# Patient Record
Sex: Female | Born: 1950
Health system: Southern US, Community
[De-identification: ages and names within clinical notes are randomized; demographics above are authoritative.]

## PROBLEM LIST (undated history)

## (undated) DIAGNOSIS — I10 Essential (primary) hypertension: Secondary | ICD-10-CM

## (undated) HISTORY — PX: OTHER SURGICAL HISTORY: SHX169

## (undated) HISTORY — PX: MOHS SURGERY: SUR867

## (undated) HISTORY — PX: COLONOSCOPY: SHX174

## (undated) HISTORY — DX: Essential (primary) hypertension: I10

---

## 1998-05-28 ENCOUNTER — Other Ambulatory Visit: Admission: RE | Admit: 1998-05-28 | Discharge: 1998-05-28 | Payer: Self-pay | Admitting: Obstetrics & Gynecology

## 2002-02-20 ENCOUNTER — Other Ambulatory Visit: Admission: RE | Admit: 2002-02-20 | Discharge: 2002-02-20 | Payer: Self-pay | Admitting: Obstetrics & Gynecology

## 2002-05-13 ENCOUNTER — Encounter: Payer: Self-pay | Admitting: Obstetrics & Gynecology

## 2002-05-13 ENCOUNTER — Encounter: Admission: RE | Admit: 2002-05-13 | Discharge: 2002-05-13 | Payer: Self-pay | Admitting: Obstetrics & Gynecology

## 2003-04-13 ENCOUNTER — Other Ambulatory Visit: Admission: RE | Admit: 2003-04-13 | Discharge: 2003-04-13 | Payer: Self-pay | Admitting: Obstetrics & Gynecology

## 2004-07-07 ENCOUNTER — Other Ambulatory Visit: Admission: RE | Admit: 2004-07-07 | Discharge: 2004-07-07 | Payer: Self-pay | Admitting: Obstetrics & Gynecology

## 2006-04-23 ENCOUNTER — Ambulatory Visit: Payer: Self-pay | Admitting: Family Medicine

## 2006-04-23 LAB — CONVERTED CEMR LAB
ALT: 27 units/L (ref 0–40)
AST: 31 units/L (ref 0–37)
Albumin: 4.1 g/dL (ref 3.5–5.2)
Alkaline Phosphatase: 71 units/L (ref 39–117)
BUN: 15 mg/dL (ref 6–23)
Bilirubin, Direct: 0.1 mg/dL (ref 0.0–0.3)
CO2: 29 meq/L (ref 19–32)
Calcium: 9.7 mg/dL (ref 8.4–10.5)
Chloride: 103 meq/L (ref 96–112)
Cholesterol: 279 mg/dL (ref 0–200)
Creatinine, Ser: 0.8 mg/dL (ref 0.4–1.2)
Direct LDL: 197.8 mg/dL
GFR calc Af Amer: 96 mL/min
GFR calc non Af Amer: 79 mL/min
Glucose, Bld: 110 mg/dL — ABNORMAL HIGH (ref 70–99)
HDL: 55.3 mg/dL (ref >39.0)
Potassium: 4.1 meq/L (ref 3.5–5.1)
Sodium: 142 meq/L (ref 135–145)
Total Bilirubin: 1.2 mg/dL (ref 0.3–1.2)
Total CHOL/HDL Ratio: 5
Total Protein: 7.2 g/dL (ref 6.0–8.3)
Triglycerides: 116 mg/dL (ref 0–149)
VLDL: 23 mg/dL (ref 0–40)

## 2006-08-06 DIAGNOSIS — I10 Essential (primary) hypertension: Secondary | ICD-10-CM | POA: Insufficient documentation

## 2006-08-21 ENCOUNTER — Ambulatory Visit: Payer: Self-pay | Admitting: Family Medicine

## 2006-08-31 ENCOUNTER — Encounter (INDEPENDENT_AMBULATORY_CARE_PROVIDER_SITE_OTHER): Payer: Self-pay | Admitting: *Deleted

## 2006-11-23 ENCOUNTER — Ambulatory Visit: Payer: Self-pay | Admitting: Family Medicine

## 2006-11-28 ENCOUNTER — Encounter (INDEPENDENT_AMBULATORY_CARE_PROVIDER_SITE_OTHER): Payer: Self-pay | Admitting: *Deleted

## 2006-11-28 LAB — CONVERTED CEMR LAB
BUN: 11 mg/dL (ref 6–23)
CO2: 30 meq/L (ref 19–32)
Calcium: 9.4 mg/dL (ref 8.4–10.5)
Chloride: 104 meq/L (ref 96–112)
Creatinine, Ser: 0.8 mg/dL (ref 0.4–1.2)
GFR calc Af Amer: 96 mL/min
GFR calc non Af Amer: 79 mL/min
Glucose, Bld: 94 mg/dL (ref 70–99)
Hgb A1c MFr Bld: 6.5 % — ABNORMAL HIGH (ref 4.6–6.0)
Potassium: 3.7 meq/L (ref 3.5–5.1)
Sodium: 140 meq/L (ref 135–145)

## 2007-02-18 ENCOUNTER — Ambulatory Visit: Payer: Self-pay | Admitting: Family Medicine

## 2007-02-20 LAB — CONVERTED CEMR LAB
BUN: 11 mg/dL (ref 6–23)
CO2: 30 meq/L (ref 19–32)
Calcium: 9.4 mg/dL (ref 8.4–10.5)
Chloride: 100 meq/L (ref 96–112)
Creatinine, Ser: 0.9 mg/dL (ref 0.4–1.2)
GFR calc Af Amer: 84 mL/min
GFR calc non Af Amer: 69 mL/min
Glucose, Bld: 97 mg/dL (ref 70–99)
Hgb A1c MFr Bld: 5.5 % (ref 4.6–6.0)
Potassium: 4 meq/L (ref 3.5–5.1)
Sodium: 138 meq/L (ref 135–145)

## 2007-06-06 ENCOUNTER — Telehealth (INDEPENDENT_AMBULATORY_CARE_PROVIDER_SITE_OTHER): Payer: Self-pay | Admitting: *Deleted

## 2007-09-18 ENCOUNTER — Telehealth (INDEPENDENT_AMBULATORY_CARE_PROVIDER_SITE_OTHER): Payer: Self-pay | Admitting: *Deleted

## 2007-10-22 ENCOUNTER — Telehealth (INDEPENDENT_AMBULATORY_CARE_PROVIDER_SITE_OTHER): Payer: Self-pay | Admitting: *Deleted

## 2008-03-23 ENCOUNTER — Telehealth: Payer: Self-pay | Admitting: Family Medicine

## 2008-03-30 ENCOUNTER — Ambulatory Visit: Payer: Self-pay | Admitting: Family Medicine

## 2008-03-30 DIAGNOSIS — E785 Hyperlipidemia, unspecified: Secondary | ICD-10-CM | POA: Insufficient documentation

## 2008-04-02 ENCOUNTER — Ambulatory Visit: Payer: Self-pay | Admitting: Family Medicine

## 2008-04-20 LAB — CONVERTED CEMR LAB
ALT: 27 units/L (ref 0–35)
AST: 23 units/L (ref 0–37)
Albumin: 3.9 g/dL (ref 3.5–5.2)
Alkaline Phosphatase: 53 units/L (ref 39–117)
BUN: 17 mg/dL (ref 6–23)
Bilirubin, Direct: 0.1 mg/dL (ref 0.0–0.3)
CO2: 30 meq/L (ref 19–32)
Chloride: 102 meq/L (ref 96–112)
Cholesterol: 219 mg/dL (ref 0–200)
Creatinine, Ser: 0.8 mg/dL (ref 0.4–1.2)
Direct LDL: 146.2 mg/dL
GFR calc Af Amer: 95 mL/min
Glucose, Bld: 100 mg/dL — ABNORMAL HIGH (ref 70–99)
HDL: 58.8 mg/dL (ref >39.0)
Total Bilirubin: 1 mg/dL (ref 0.3–1.2)
Total CHOL/HDL Ratio: 3.7
Total Protein: 7.2 g/dL (ref 6.0–8.3)
Triglycerides: 78 mg/dL (ref 0–149)
VLDL: 16 mg/dL (ref 0–40)

## 2008-04-21 ENCOUNTER — Encounter (INDEPENDENT_AMBULATORY_CARE_PROVIDER_SITE_OTHER): Payer: Self-pay | Admitting: *Deleted

## 2008-04-28 ENCOUNTER — Ambulatory Visit: Payer: Self-pay | Admitting: Family Medicine

## 2008-04-28 ENCOUNTER — Other Ambulatory Visit: Admission: RE | Admit: 2008-04-28 | Discharge: 2008-04-28 | Payer: Self-pay | Admitting: Family Medicine

## 2008-04-28 ENCOUNTER — Encounter: Payer: Self-pay | Admitting: Family Medicine

## 2008-04-30 ENCOUNTER — Encounter (INDEPENDENT_AMBULATORY_CARE_PROVIDER_SITE_OTHER): Payer: Self-pay | Admitting: *Deleted

## 2008-05-06 ENCOUNTER — Encounter (INDEPENDENT_AMBULATORY_CARE_PROVIDER_SITE_OTHER): Payer: Self-pay | Admitting: *Deleted

## 2008-07-28 ENCOUNTER — Ambulatory Visit: Payer: Self-pay | Admitting: Family Medicine

## 2008-07-28 ENCOUNTER — Encounter (INDEPENDENT_AMBULATORY_CARE_PROVIDER_SITE_OTHER): Payer: Self-pay | Admitting: *Deleted

## 2008-07-28 LAB — CONVERTED CEMR LAB
Alkaline Phosphatase: 62 units/L (ref 39–117)
Bilirubin, Direct: 0.1 mg/dL (ref 0.0–0.3)
CO2: 30 meq/L (ref 19–32)
Calcium: 9.4 mg/dL (ref 8.4–10.5)
Chloride: 101 meq/L (ref 96–112)
Sodium: 138 meq/L (ref 135–145)
Total CHOL/HDL Ratio: 3
Total Protein: 7.2 g/dL (ref 6.0–8.3)

## 2008-10-29 ENCOUNTER — Ambulatory Visit: Payer: Self-pay | Admitting: Family Medicine

## 2009-02-01 ENCOUNTER — Ambulatory Visit: Payer: Self-pay | Admitting: Family Medicine

## 2009-02-01 DIAGNOSIS — Z780 Asymptomatic menopausal state: Secondary | ICD-10-CM | POA: Insufficient documentation

## 2009-02-02 ENCOUNTER — Telehealth (INDEPENDENT_AMBULATORY_CARE_PROVIDER_SITE_OTHER): Payer: Self-pay | Admitting: *Deleted

## 2009-02-02 LAB — CONVERTED CEMR LAB
Alkaline Phosphatase: 66 units/L (ref 39–117)
Bilirubin, Direct: 0.1 mg/dL (ref 0.0–0.3)
CO2: 31 meq/L (ref 19–32)
Calcium: 9.5 mg/dL (ref 8.4–10.5)
GFR calc non Af Amer: 91.69 mL/min (ref >60)
HDL: 59.1 mg/dL (ref >39.00)
Sodium: 140 meq/L (ref 135–145)
Total Bilirubin: 0.8 mg/dL (ref 0.3–1.2)
Total CHOL/HDL Ratio: 3
Total Protein: 7.9 g/dL (ref 6.0–8.3)

## 2009-02-16 ENCOUNTER — Encounter: Payer: Self-pay | Admitting: Family Medicine

## 2009-02-16 ENCOUNTER — Encounter: Admission: RE | Admit: 2009-02-16 | Discharge: 2009-02-16 | Payer: Self-pay | Admitting: Family Medicine

## 2009-02-23 ENCOUNTER — Telehealth: Payer: Self-pay | Admitting: Family Medicine

## 2009-11-12 ENCOUNTER — Encounter (INDEPENDENT_AMBULATORY_CARE_PROVIDER_SITE_OTHER): Payer: Self-pay | Admitting: *Deleted

## 2009-12-01 ENCOUNTER — Telehealth (INDEPENDENT_AMBULATORY_CARE_PROVIDER_SITE_OTHER): Payer: Self-pay | Admitting: *Deleted

## 2009-12-03 ENCOUNTER — Ambulatory Visit: Payer: Self-pay | Admitting: Family Medicine

## 2009-12-17 ENCOUNTER — Ambulatory Visit: Payer: Self-pay | Admitting: Family Medicine

## 2009-12-21 ENCOUNTER — Encounter: Payer: Self-pay | Admitting: Family Medicine

## 2009-12-31 ENCOUNTER — Telehealth (INDEPENDENT_AMBULATORY_CARE_PROVIDER_SITE_OTHER): Payer: Self-pay | Admitting: *Deleted

## 2010-01-07 ENCOUNTER — Ambulatory Visit: Payer: Self-pay | Admitting: Family Medicine

## 2010-01-07 DIAGNOSIS — E039 Hypothyroidism, unspecified: Secondary | ICD-10-CM | POA: Insufficient documentation

## 2010-01-10 LAB — CONVERTED CEMR LAB: Free T4: 0.93 ng/dL (ref 0.60–1.60)

## 2010-04-03 LAB — CONVERTED CEMR LAB
BUN: 12 mg/dL (ref 6–23)
Basophils Absolute: 0 10*3/uL (ref 0.0–0.1)
Basophils Relative: 0.8 % (ref 0.0–3.0)
CO2: 30 meq/L (ref 19–32)
Calcium: 9.3 mg/dL (ref 8.4–10.5)
Chloride: 98 meq/L (ref 96–112)
Creatinine, Ser: 0.8 mg/dL (ref 0.4–1.2)
Eosinophils Absolute: 0.1 10*3/uL (ref 0.0–0.7)
GFR calc Af Amer: 96 mL/min
GFR calc non Af Amer: 79 mL/min
Glucose, Bld: 96 mg/dL (ref 70–99)
Hemoglobin: 14.4 g/dL (ref 12.0–15.0)
Hgb A1c MFr Bld: 5.6 % (ref 4.6–6.0)
MCHC: 34.7 g/dL (ref 30.0–36.0)
MCV: 88.8 fL (ref 78.0–100.0)
Monocytes Absolute: 0.3 10*3/uL (ref 0.1–1.0)
Neutro Abs: 3 10*3/uL (ref 1.4–7.7)
Potassium: 3.9 meq/L (ref 3.5–5.1)
RBC: 4.68 M/uL (ref 3.87–5.11)
Sodium: 136 meq/L (ref 135–145)

## 2010-04-07 NOTE — Progress Notes (Signed)
----   Converted from flag ---- ---- 12/31/2009 10:39 AM, Okey Regal Spring wrote: mailed letter ---- 12/27/2009 4:33 PM, Okey Regal Spring wrote: lmom for patient to call & schedule appt    ---- 12/27/2009 11:37 AM, Harold Barban wrote: Since I will not be at that office till thursday can you call this patient to sch her labs. I call her on friday and left a message. Thanks!  ---- 12/24/2009 10:47 AM, Almeta Monas CMA (AAMA) wrote: Can you make this pt an appt to review labs one day next week with Dr.Lowne please. Thank You ------------------------------

## 2010-04-07 NOTE — Assessment & Plan Note (Signed)
Summary: Review [facility]//KP   Vital Signs:  Patient profile:   60 year old female Height:      63 inches Weight:      161.6 pounds Pulse rate:   72 / minute Pulse rhythm:   regular BP sitting:   130 / 74  (left arm) Cuff size:   regular  Vitals Entered By: Almeta Monas CMA Duncan Dull) (January 07, 2010 8:17 AM) CC: Review [facility]   History of Present Illness: Pt here to discuss [facility]----see labs scanned in .  Current Medications (verified): 1)  Toprol Xl 100 Mg Xr24h-Tab (Metoprolol Succinate) .Marland Kitchen.. 1 By Mouth Once Daily* 2)  Hydrochlorothiazide 50 Mg Tabs (Hydrochlorothiazide) .... Take 1 Tablet By Mouth Once A Day 3)  Aspir-Low 81 Mg Tbec (Aspirin) .Marland Kitchen.. 1 By Mouth Qd 4)  Multivitamins  Tabs (Multiple Vitamin) .Marland Kitchen.. 1 By Mouth Once Daily  Allergies (verified): No Known Drug Allergies  Physical Exam  General:  Well-developed,well-nourished,in no acute distress; alert,appropriate and cooperative throughout examination Psych:  Cognition and judgment appear intact. Alert and cooperative with normal attention span and concentration. No apparent delusions, illusions, hallucinations   Impression & Recommendations:  Problem # 1:  HYPOTHYROIDISM (ICD-244.9)  Orders: Venipuncture (04540) TLB-TSH (Thyroid Stimulating Hormone) (84443-TSH) TLB-T3, Free (Triiodothyronine) (84481-T3FREE) TLB-T4 (Thyrox), Free [phone])  Labs Reviewed: TSH: 3.75 (04/28/2008)    HgBA1c: 5.5 (02/18/2007) Chol: 189 (02/01/2009)   HDL: 59.10 (02/01/2009)   LDL: 111 (02/01/2009)   TG: 97.0 (02/01/2009)  Problem # 2:  HYPERLIPIDEMIA (ICD-272.4) Pt needs cholesterol meds but TSH high---recheck thyroid and correct if needed then we will recheck cholesterol Labs Reviewed: SGOT: 27 (02/01/2009)   SGPT: 28 (02/01/2009)   HDL:59.10 (02/01/2009), 62.00 (07/28/2008)  LDL:111 (02/01/2009), 99 (29/56/2130)  Chol:189 (02/01/2009), 175 (07/28/2008)  Trig:97.0 (02/01/2009), 70.0  (07/28/2008)  Problem # 3:  HYPERTENSION (ICD-401.9)  Her updated medication list for this problem includes:    Toprol Xl 100 Mg Xr24h-tab (Metoprolol succinate) .Marland Kitchen... 1 by mouth once daily*    Hydrochlorothiazide 50 Mg Tabs (Hydrochlorothiazide) .Marland Kitchen... Take 1 tablet by mouth once a day  BP today: 130/74 Prior BP: 120/90 (12/17/2009)  Labs Reviewed: K+: 3.5 (02/01/2009) Creat: : 0.7 (02/01/2009)   Chol: 189 (02/01/2009)   HDL: 59.10 (02/01/2009)   LDL: 111 (02/01/2009)   TG: 97.0 (02/01/2009)  Complete Medication List: 1)  Toprol Xl 100 Mg Xr24h-tab (Metoprolol succinate) .Marland Kitchen.. 1 by mouth once daily* 2)  Hydrochlorothiazide 50 Mg Tabs (Hydrochlorothiazide) .... Take 1 tablet by mouth once a day 3)  Aspir-low 81 Mg Tbec (Aspirin) .Marland Kitchen.. 1 by mouth qd 4)  Multivitamins Tabs (Multiple vitamin) .Marland Kitchen.. 1 by mouth once daily   Orders Added: 1)  Venipuncture [36415] 2)  TLB-TSH (Thyroid Stimulating Hormone) [84443-TSH] 3)  TLB-T3, Free (Triiodothyronine) [86578-I6NGEX] 4)  TLB-T4 (Thyrox), Free [52841-LK4M] 5)  Est. Patient Level III [01027]  Appended Document: Review [facility]//KP

## 2010-04-07 NOTE — Assessment & Plan Note (Signed)
Summary: rto per dr request/cbs   Vital Signs:  Patient profile:   60 year old female Height:      63 inches Weight:      162.2 pounds BMI:     28.84 Temp:     98.0 degrees F oral Pulse rate:   84 / minute Pulse rhythm:   regular BP sitting:   130 / 86  (right arm) Cuff size:   regular  Vitals Entered By: Almeta Monas CMA Duncan Dull) (December 03, 2009 8:23 AM) CC: f/u on meds, pt stopped cholesterol meds   History of Present Illness:  Hyperlipidemia follow-up      This is a 60 year old woman who presents for Hyperlipidemia follow-up.  The patient denies muscle aches, GI upset, abdominal pain, flushing, itching, constipation, diarrhea, and fatigue.  The patient denies the following symptoms: chest pain/pressure, exercise intolerance, dypsnea, palpitations, syncope, and pedal edema.  Compliance with medications (by patient report) has been near poor.  Dietary compliance has been fair.  The patient reports exercising occasionally.  Adjunctive measures currently used by the patient include ASA.  Pt stopped meds ---"just because I didn't want to take all that medicine."  Hypertension follow-up      The patient also presents for Hypertension follow-up.  The patient denies lightheadedness, urinary frequency, headaches, edema, impotence, rash, and fatigue.  The patient denies the following associated symptoms: chest pain, chest pressure, exercise intolerance, dyspnea, palpitations, syncope, leg edema, and pedal edema.  Compliance with medications (by patient report) has been sporadic.  The patient reports that dietary compliance has been fair.  The patient reports exercising occasionally.  Adjunctive measures currently used by the patient include salt restriction.    Preventive Screening-Counseling & Management  Alcohol-Tobacco     Alcohol drinks/day: <1     Alcohol type: rare     Smoking Status: never     Passive Smoke Exposure: no  Caffeine-Diet-Exercise     Caffeine use/day: 3     Does  Patient Exercise: no  Current Medications (verified): 1)  Toprol Xl 100 Mg Xr24h-Tab (Metoprolol Succinate) .Marland Kitchen.. 1 By Mouth Once Daily* 2)  Hydrochlorothiazide 50 Mg Tabs (Hydrochlorothiazide) .... Take 1 Tablet By Mouth Once A Day 3)  Aspir-Low 81 Mg Tbec (Aspirin) .Marland Kitchen.. 1 By Mouth Qd 4)  Multivitamins  Tabs (Multiple Vitamin) .Marland Kitchen.. 1 By Mouth Once Daily  Allergies (verified): No Known Drug Allergies  Past History:  Past Medical History: Last updated: 03/30/2008 Hypertension High Cholesteral Hyperlipidemia  Past Surgical History: Last updated: 04/28/2008 Denies surgical history  Family History: Last updated: 08/06/2006 Family History Hypertension Family Hitory Heat Attack  Social History: Last updated: 04/28/2008 Occupation: product development---[employer] Married Never Smoked Alcohol use-yes-rare Regular exercise-no  Risk Factors: Alcohol Use: <1 (12/03/2009) Caffeine Use: 3 (12/03/2009) Exercise: no (12/03/2009)  Risk Factors: Smoking Status: never (12/03/2009) Passive Smoke Exposure: no (12/03/2009)  Family History: Reviewed history from 08/06/2006 and no changes required. Family History Hypertension Family Hitory Heat Attack  Social History: Reviewed history from 04/28/2008 and no changes required. 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   Lungs:  Normal respiratory effort, chest expands symmetrically. Lungs are clear to auscultation, no crackles or wheezes. Heart:  Normal rate and regular rhythm. S1 and S2 normal without gallop, murmur,  click, rub or other extra sounds. Abdomen:  Bowel sounds positive,abdomen soft and non-tender without masses,  organomegaly or hernias noted. Extremities:  No clubbing, cyanosis, edema, or deformity noted with normal full range of motion of all joints.   Psych:  Oriented X3 and normally interactive.     Impression & Recommendations:  Problem # 1:  HYPERLIPIDEMIA (ICD-272.4) D/W pt importance of meds--we will check advanced lipid testing and discuss further with pt in 2 weeks The following medications were removed from the medication list:    Vytorin 10-40 Mg Tabs (Ezetimibe-simvastatin) .Marland Kitchen... 1 by mouth at bedtime.    Zocor 80 Mg Tabs (Simvastatin) .Marland Kitchen... 1 by mouth at bedtime. needs labwork before additonal refills.  Orders: T- * Misc. Laboratory test (99999)--[facility]  Labs Reviewed: SGOT: 27 (02/01/2009)   SGPT: 28 (02/01/2009)   HDL:59.10 (02/01/2009), 62.00 (07/28/2008)  LDL:111 (02/01/2009), 99 (04/54/0981)  Chol:189 (02/01/2009), 175 (07/28/2008)  Trig:97.0 (02/01/2009), 70.0 (07/28/2008)  Problem # 2:  HYPERTENSION (ICD-401.9)  Her updated medication list for this problem includes:    Toprol Xl 100 Mg Xr24h-tab (Metoprolol succinate) .Marland Kitchen... 1 by mouth once daily*    Hydrochlorothiazide 50 Mg Tabs (Hydrochlorothiazide) .Marland Kitchen... Take 1 tablet by mouth once a day  Orders: T- * Misc. Laboratory test [phone])  BP today: 130/86 Prior BP: 130/80 (02/01/2009)  Labs Reviewed: K+: 3.5 (02/01/2009) Creat: : 0.7 (02/01/2009)   Chol: 189 (02/01/2009)   HDL: 59.10 (02/01/2009)   LDL: 111 (02/01/2009)   TG: 97.0 (02/01/2009)  Complete Medication List: 1)  Toprol Xl 100 Mg Xr24h-tab (Metoprolol succinate) .Marland Kitchen.. 1 by mouth once daily* 2)  Hydrochlorothiazide 50 Mg Tabs (Hydrochlorothiazide) .... Take 1 tablet by mouth once a day 3)  Aspir-low 81 Mg Tbec (Aspirin) .Marland Kitchen.. 1 by mouth qd 4)  Multivitamins Tabs (Multiple vitamin) .Marland Kitchen.. 1 by mouth once daily  Other Orders: Venipuncture (82956)  Patient Instructions: 1)  Please schedule a follow-up appointment in 2 weeks to review  labs Prescriptions: HYDROCHLOROTHIAZIDE 50 MG TABS (HYDROCHLOROTHIAZIDE) Take 1 tablet by mouth once a day  #90 Each x 3   Entered and Authorized by:   Loreen Freud DO   Signed by:   Loreen Freud DO on 12/03/2009   Method used:   Electronically to        [facility]* (retail)       [address] [place], [place]  [address]       Ph: [phone]       Fax: [phone]   RxID:   [id number] TOPROL XL 100 MG XR24H-TAB (METOPROLOL SUCCINATE) 1 by mouth once daily*  #90 x 3   Entered and Authorized by:   Loreen Freud DO   Signed by:   Loreen Freud DO on 12/03/2009   Method used:   Electronically to        [facility]* (retail)       [address] [place], [place]  [address]       Ph: [phone]       Fax: [phone]   RxID:   [id number]

## 2010-04-07 NOTE — Progress Notes (Signed)
Summary: toprol refill   Phone Note Refill Request   Refills Requested: Medication #1:  TOPROL XL 100 MG XR24H-TAB 1 by mouth once daily*8OFFICE VISIT DUE NOW ** [facility] - patient out of med  Initial call taken by: Okey Regal Spring,  December 01, 2009 8:59 AM    New/Updated Medications: TOPROL XL 100 MG XR24H-TAB (METOPROLOL SUCCINATE) 1 by mouth once daily* Prescriptions: TOPROL XL 100 MG XR24H-TAB (METOPROLOL SUCCINATE) 1 by mouth once daily*  #30 x 0   Entered by:   Doristine Devoid CMA   Authorized by:   Loreen Freud DO   Signed by:   Doristine Devoid CMA on 12/01/2009   Method used:   Electronically to        [facility]* (retail)       [address] [place], [place]  [address]       Ph: [phone]       Fax: [phone]   RxID:   [id number]

## 2010-04-07 NOTE — Assessment & Plan Note (Signed)
Summary: review lab work//lch   Vital Signs:  Patient profile:   60 year old female Weight:      160 pounds Temp:     98.2 degrees F oral Pulse rate:   72 / minute Pulse rhythm:   regular BP sitting:   120 / 90  (right arm) Cuff size:   regular  Vitals Entered By: Almeta Monas CMA Duncan Dull) (December 17, 2009 10:38 AM) CC: bp check and lab f/u Comments 150/83 right arm, and 148/87 on left arm on pt's cuff   History of Present Illness: pt here to go over labs---her labs are not in--- we will call when they come in   Current Medications (verified): 1)  Toprol Xl 100 Mg Xr24h-Tab (Metoprolol Succinate) .Marland Kitchen.. 1 By Mouth Once Daily* 2)  Hydrochlorothiazide 50 Mg Tabs (Hydrochlorothiazide) .... Take 1 Tablet By Mouth Once A Day 3)  Aspir-Low 81 Mg Tbec (Aspirin) .Marland Kitchen.. 1 By Mouth Qd 4)  Multivitamins  Tabs (Multiple Vitamin) .Marland Kitchen.. 1 By Mouth Once Daily  Allergies (verified): No Known Drug Allergies   Complete Medication List: 1)  Toprol Xl 100 Mg Xr24h-tab (Metoprolol succinate) .Marland Kitchen.. 1 by mouth once daily* 2)  Hydrochlorothiazide 50 Mg Tabs (Hydrochlorothiazide) .... Take 1 tablet by mouth once a day 3)  Aspir-low 81 Mg Tbec (Aspirin) .Marland Kitchen.. 1 by mouth qd 4)  Multivitamins Tabs (Multiple vitamin) .Marland Kitchen.. 1 by mouth once daily  Other Orders: No Charge Patient Arrived (NCPA0) (NCPA0)

## 2010-04-07 NOTE — Letter (Signed)
Summary: Primary Care Appointment Letter  [facility]  [address] [place], [place] [address]   Phone: [phone]  Fax: [phone]    11/12/2009 MRN: [id number]  [facility] [address] [place], [place]  [address]  Dear Ms. Tippins,   Your Primary Care Physician Loreen Freud DO has indicated that:    ___X____it is time to schedule an appointment.    _______you missed your appointment on______ and need to call and          reschedule.    _______you need to have lab work done.    _______you need to schedule an appointment discuss lab or test results.    _______you need to call to reschedule your appointment that is                       scheduled on _________.     Please call our office as soon as possible. Our phone number is [phone]. Please press option 1. Our office is open 8a-12noon and 1p-5p, Monday through Friday.     Thank you,    [facility] Scheduler

## 2010-05-20 IMAGING — MG MM DIGITAL SCREENING
4 series · 4 of 4 positions shown · non-contrast
Comparison: Prior studies.

DG SCREEN MAMMOGRAM BILATERAL
Bilateral CC and MLO view(s) were taken.

DIGITAL SCREENING MAMMOGRAM WITH CAD:

[R CC]
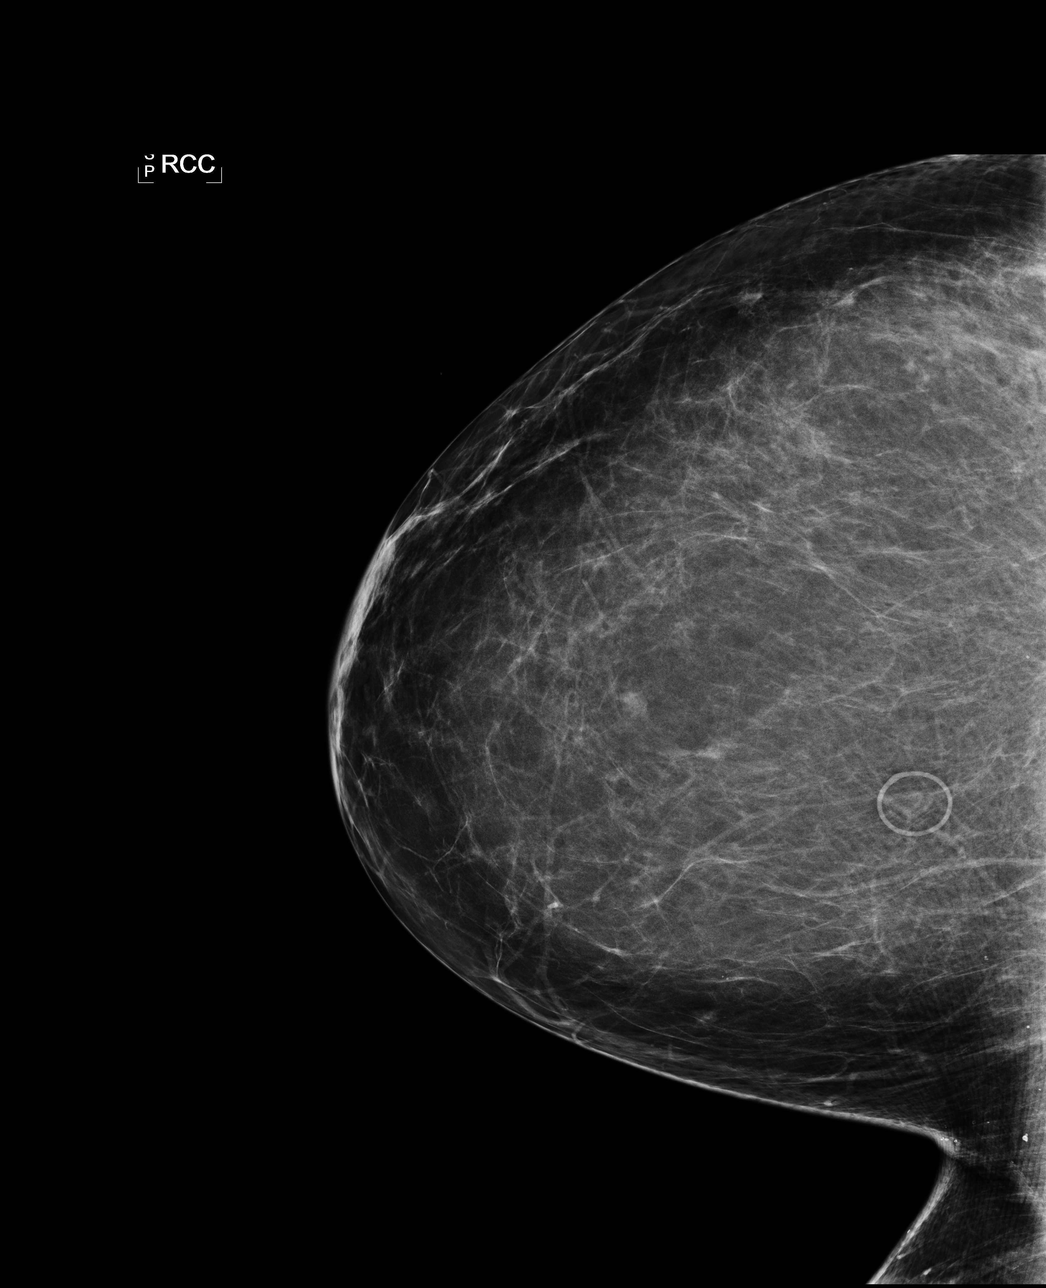

[L CC]
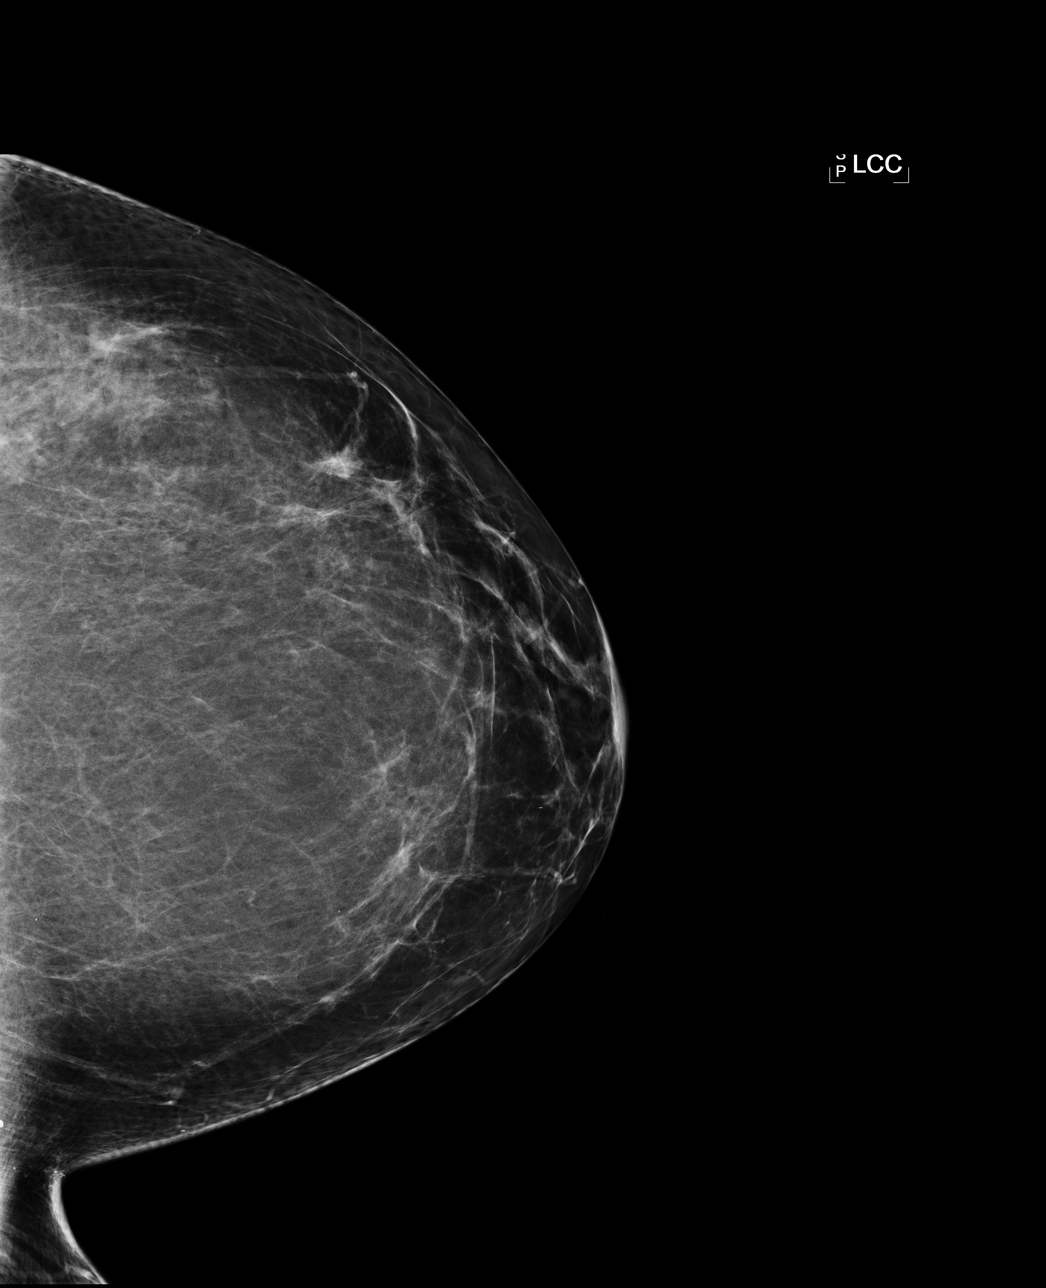

[L MLO]
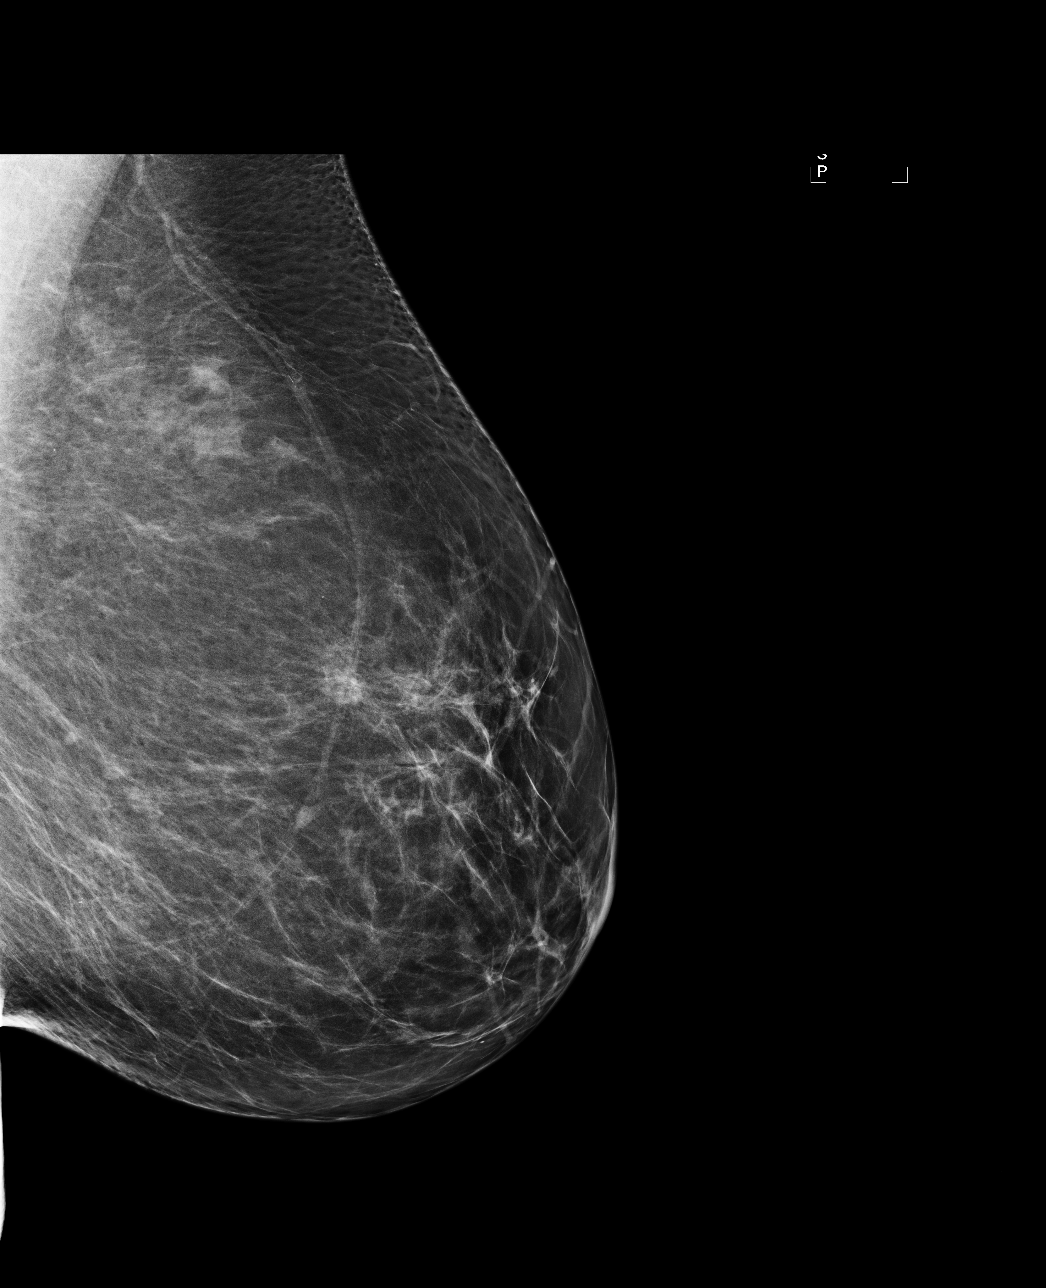

[R MLO]
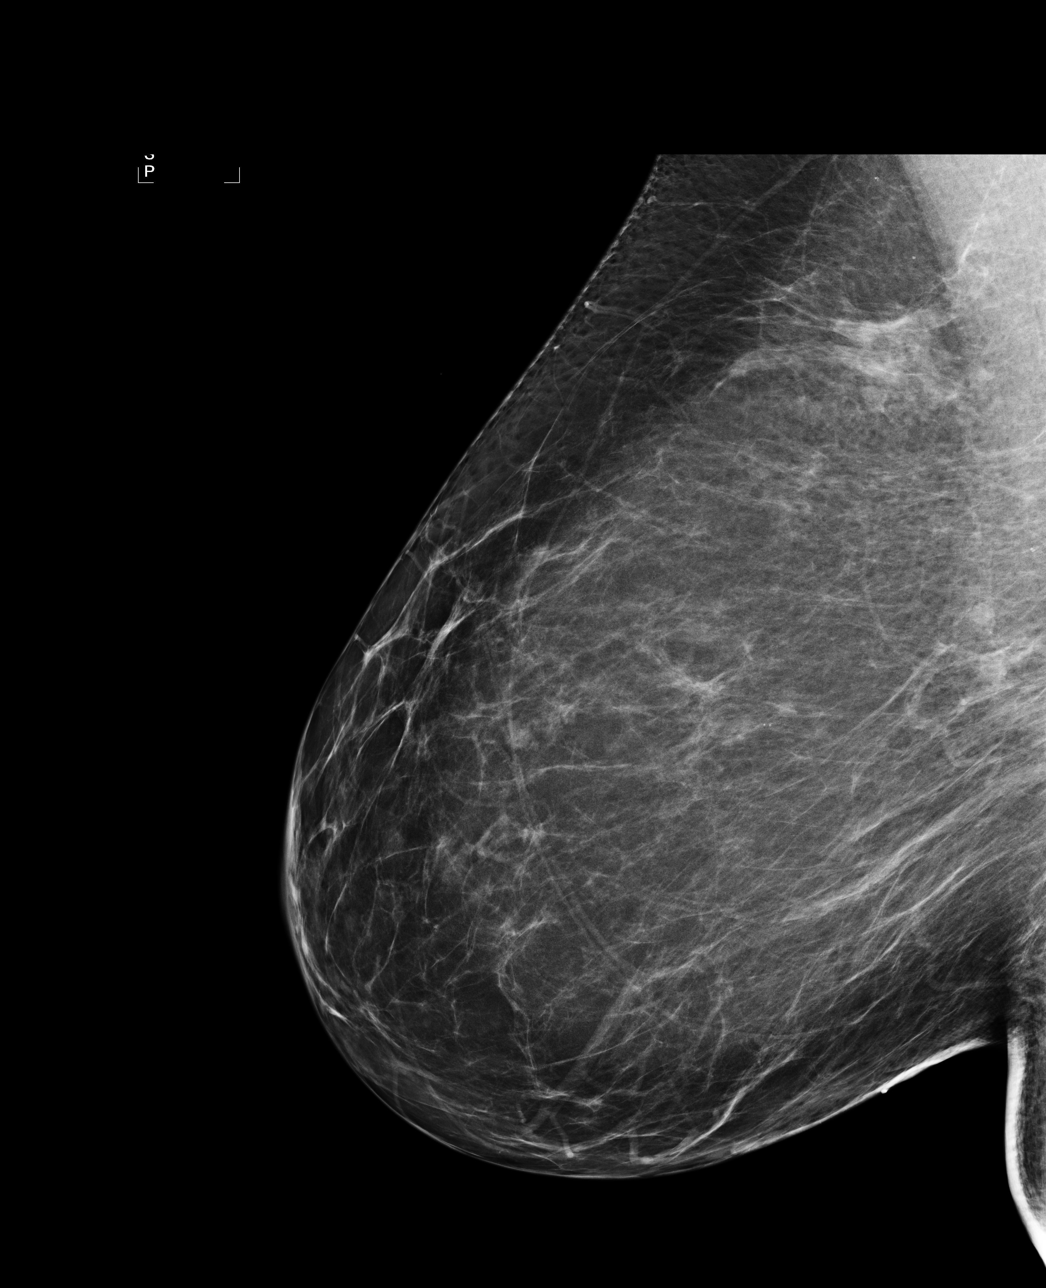

[4 of 4 positions shown; findings below may reference images not displayed]

There are scattered fibroglandular densities.  There is no dominant mass, architectural distortion 
or calcification to suggest malignancy.

Images were processed with CAD.
IMPRESSION: No mammographic evidence of malignancy.  Suggest yearly screening mammography.

A result letter of this screening mammogram will be mailed directly to the patient.

ASSESSMENT: Negative - BI-RADS 1

Screening mammogram in 1 year.
,

## 2011-01-20 ENCOUNTER — Other Ambulatory Visit: Payer: Self-pay | Admitting: Family Medicine

## 2011-01-20 NOTE — Telephone Encounter (Signed)
Letter mailed to schedule a CPE.      KP 

## 2011-01-24 ENCOUNTER — Other Ambulatory Visit: Payer: Self-pay | Admitting: Family Medicine

## 2011-01-25 ENCOUNTER — Other Ambulatory Visit: Payer: Self-pay | Admitting: Family Medicine

## 2011-01-25 MED ORDER — METOPROLOL SUCCINATE ER 100 MG PO TB24: 100.0000 mg | ORAL_TABLET | Freq: Every day | ORAL | Status: DC

## 2011-01-25 MED ORDER — HYDROCHLOROTHIAZIDE 50 MG PO TABS: 50.0000 mg | ORAL_TABLET | Freq: Every day | ORAL | Status: DC

## 2011-01-25 NOTE — Telephone Encounter (Signed)
Refaxed   KP 

## 2011-02-20 ENCOUNTER — Other Ambulatory Visit: Payer: Self-pay | Admitting: Family Medicine

## 2011-03-09 ENCOUNTER — Ambulatory Visit (INDEPENDENT_AMBULATORY_CARE_PROVIDER_SITE_OTHER): Payer: BC Managed Care – PPO | Admitting: Family Medicine

## 2011-03-09 ENCOUNTER — Encounter: Payer: Self-pay | Admitting: Family Medicine

## 2011-03-09 ENCOUNTER — Other Ambulatory Visit (HOSPITAL_COMMUNITY)
Admission: RE | Admit: 2011-03-09 | Discharge: 2011-03-09 | Disposition: A | Discharge status: Home / Self Care | Payer: BC Managed Care – PPO | Source: Ambulatory Visit | Attending: Family Medicine | Admitting: Family Medicine

## 2011-03-09 DIAGNOSIS — E785 Hyperlipidemia, unspecified: Secondary | ICD-10-CM

## 2011-03-09 DIAGNOSIS — Z780 Asymptomatic menopausal state: Secondary | ICD-10-CM

## 2011-03-09 DIAGNOSIS — Z0000 Encounter for general adult medical examination without abnormal findings: Secondary | ICD-10-CM

## 2011-03-09 DIAGNOSIS — L259 Unspecified contact dermatitis, unspecified cause: Secondary | ICD-10-CM

## 2011-03-09 DIAGNOSIS — I10 Essential (primary) hypertension: Secondary | ICD-10-CM

## 2011-03-09 DIAGNOSIS — E039 Hypothyroidism, unspecified: Secondary | ICD-10-CM

## 2011-03-09 DIAGNOSIS — Z23 Encounter for immunization: Secondary | ICD-10-CM

## 2011-03-09 DIAGNOSIS — Z01419 Encounter for gynecological examination (general) (routine) without abnormal findings: Secondary | ICD-10-CM | POA: Insufficient documentation

## 2011-03-09 DIAGNOSIS — L309 Dermatitis, unspecified: Secondary | ICD-10-CM

## 2011-03-09 DIAGNOSIS — N6459 Other signs and symptoms in breast: Secondary | ICD-10-CM

## 2011-03-09 MED ORDER — METOPROLOL SUCCINATE ER 100 MG PO TB24: 100.0000 mg | ORAL_TABLET | Freq: Every day | ORAL | Status: DC

## 2011-03-09 MED ORDER — HYDROCHLOROTHIAZIDE 50 MG PO TABS: 50.0000 mg | ORAL_TABLET | Freq: Every day | ORAL | Status: DC

## 2011-03-09 NOTE — Assessment & Plan Note (Addendum)
Check labs 

## 2011-03-09 NOTE — Patient Instructions (Signed)

## 2011-03-09 NOTE — Assessment & Plan Note (Signed)
Check labs 

## 2011-03-09 NOTE — Progress Notes (Signed)
Subjective:     Amy Zimmerman is a 61 y.o. female and is here for a comprehensive physical exam. The patient reports no problems.  History   Social History  . Marital Status: Married    Spouse Name: N/A    Number of Children: N/A  . Years of Education: N/A   Occupational History  . [employer]    Social History Main Topics  . Smoking status: Never Smoker   . Smokeless tobacco: Never Used  . Alcohol Use: No  . Drug Use: No  . Sexually Active: Yes -- Female partner(s)   Other Topics Concern  . Not on file   Social History Narrative  . No narrative on file   Health Maintenance  Topic Date Due  . Pap Smear  02/27/1970  . Tetanus/tdap  02/28/1971  . Colonoscopy  02/27/2002  . Mammogram  02/17/2011  . Influenza Vaccine  12/05/2011    The following portions of the patient's history were reviewed and updated as appropriate: allergies, current medications, past family history, past medical history, past social history, past surgical history and problem list.  Review of Systems Review of Systems  Constitutional: Negative for activity change, appetite change and fatigue.  HENT: Negative for hearing loss, congestion, tinnitus and ear discharge.  dentist q67m Eyes: Negative for visual disturbance (see optho q1y -- vision corrected to 20/20 with glasses).  Respiratory: Negative for cough, chest tightness and shortness of breath.   Cardiovascular: Negative for chest pain, palpitations and leg swelling.  Gastrointestinal: Negative for abdominal pain, diarrhea, constipation and abdominal distention.  Genitourinary: Negative for urgency, frequency, decreased urine volume and difficulty urinating.  Musculoskeletal: Negative for back pain, arthralgias and gait problem.  Skin: Negative for color change, pallor and rash.  Neurological: Negative for dizziness, light-headedness, numbness and headaches.  Hematological: Negative for adenopathy. Does not bruise/bleed easily.    Psychiatric/Behavioral: Negative for suicidal ideas, confusion, sleep disturbance, self-injury, dysphoric mood, decreased concentration and agitation.       Objective:    BP 126/80  Pulse 81  Temp(Src) 98.2 F (36.8 C) (Oral)  Ht 5' 2.25" (1.581 m)  Wt 164 lb 3.2 oz (74.481 kg)  BMI 29.79 kg/m2  SpO2 98%  General Appearance:    Alert, cooperative, no distress, appears stated age  Head:    Normocephalic, without obvious abnormality, atraumatic  Eyes:    PERRL, conjunctiva/corneas clear, EOM's intact, fundi    benign, both eyes  Ears:    Normal TM's and external ear canals, both ears  Nose:   Nares normal, septum midline, mucosa normal, no drainage    or sinus tenderness  Throat:   Lips, mucosa, and tongue normal; teeth and gums normal  Neck:   Supple, symmetrical, trachea midline, no adenopathy;    thyroid:  no enlargement/tenderness/nodules; no carotid   bruit or JVD  Back:     Symmetric, no curvature, ROM normal, no CVA tenderness  Lungs:     Clear to auscultation bilaterally, respirations unlabored  Chest Wall:    No tenderness or deformity   Heart:    Regular rate and rhythm, S1 and S2 normal, no murmur, rub   or gallop  Breast Exam:    No tenderness, masses, or nipple abnormality  Abdomen:     Soft, non-tender, bowel sounds active all four quadrants,    no masses, no organomegaly  Genitalia:    Normal female without lesion, discharge or tenderness, pap done  Rectal:    Normal  tone,, no masses or tenderness;   guaiac negative stool  Extremities:   Extremities normal, atraumatic, no cyanosis or edema  Pulses:   2+ and symmetric all extremities  Skin:   Skin color, texture, turgor normal, no rashes or lesions  Lymph nodes:   Cervical, supraclavicular, and axillary nodes normal  Neurologic:   CNII-XII intact, normal strength, sensation and reflexes    throughout     Assessment:    Healthy female exam.      Plan:    [facility] utd Check labs See After Visit Summary  for Counseling Recommendations

## 2011-03-09 NOTE — Assessment & Plan Note (Signed)
Stable con't meds 

## 2011-03-14 ENCOUNTER — Other Ambulatory Visit: Payer: Self-pay | Admitting: Family Medicine

## 2011-03-14 ENCOUNTER — Other Ambulatory Visit (INDEPENDENT_AMBULATORY_CARE_PROVIDER_SITE_OTHER): Payer: BC Managed Care – PPO

## 2011-03-14 DIAGNOSIS — I10 Essential (primary) hypertension: Secondary | ICD-10-CM

## 2011-03-14 DIAGNOSIS — Z0000 Encounter for general adult medical examination without abnormal findings: Secondary | ICD-10-CM

## 2011-03-14 LAB — LIPID PANEL
HDL: 49.9 mg/dL (ref >39.00)
Triglycerides: 162 mg/dL — ABNORMAL HIGH (ref 0.0–149.0)
VLDL: 32.4 mg/dL (ref 0.0–40.0)

## 2011-03-14 LAB — CBC WITH DIFFERENTIAL/PLATELET
Eosinophils Relative: 3.3 % (ref 0.0–5.0)
HCT: 40.8 % (ref 36.0–46.0)
Hemoglobin: 13.9 g/dL (ref 12.0–15.0)
Lymphs Abs: 2.5 10*3/uL (ref 0.7–4.0)
MCV: 87.6 fl (ref 78.0–100.0)
Monocytes Absolute: 0.4 10*3/uL (ref 0.1–1.0)
Monocytes Relative: 6.6 % (ref 3.0–12.0)
Neutro Abs: 2.7 10*3/uL (ref 1.4–7.7)
RDW: 13.2 % (ref 11.5–14.6)
WBC: 5.9 10*3/uL (ref 4.5–10.5)

## 2011-03-14 LAB — BASIC METABOLIC PANEL
Chloride: 100 mEq/L (ref 96–112)
GFR: 78.02 mL/min (ref >60.00)
Glucose, Bld: 98 mg/dL (ref 70–99)
Potassium: 4.2 mEq/L (ref 3.5–5.1)
Sodium: 140 mEq/L (ref 135–145)

## 2011-03-14 LAB — HEPATIC FUNCTION PANEL
ALT: 29 U/L (ref 0–35)
Total Bilirubin: 1 mg/dL (ref 0.3–1.2)

## 2011-03-16 ENCOUNTER — Encounter: Payer: Self-pay | Admitting: Internal Medicine

## 2011-03-21 ENCOUNTER — Ambulatory Visit
Admission: RE | Admit: 2011-03-21 | Discharge: 2011-03-21 | Disposition: A | Discharge status: Home / Self Care | Payer: BC Managed Care – PPO | Source: Ambulatory Visit | Attending: Family Medicine | Admitting: Family Medicine

## 2011-03-21 ENCOUNTER — Other Ambulatory Visit: Payer: Self-pay | Admitting: Family Medicine

## 2011-03-21 DIAGNOSIS — N6459 Other signs and symptoms in breast: Secondary | ICD-10-CM

## 2011-03-21 DIAGNOSIS — Z780 Asymptomatic menopausal state: Secondary | ICD-10-CM

## 2011-03-30 ENCOUNTER — Encounter: Payer: Self-pay | Admitting: Internal Medicine

## 2011-03-30 ENCOUNTER — Ambulatory Visit (AMBULATORY_SURGERY_CENTER): Payer: BC Managed Care – PPO | Admitting: *Deleted

## 2011-03-30 VITALS — Ht 63.5 in | Wt 162.3 lb

## 2011-03-30 DIAGNOSIS — Z1211 Encounter for screening for malignant neoplasm of colon: Secondary | ICD-10-CM

## 2011-03-30 MED ORDER — PEG-KCL-NACL-NASULF-NA ASC-C 100 G PO SOLR: ORAL | Status: DC

## 2011-04-12 ENCOUNTER — Telehealth: Payer: Self-pay | Admitting: *Deleted

## 2011-04-12 ENCOUNTER — Telehealth: Payer: Self-pay | Admitting: Internal Medicine

## 2011-04-12 NOTE — Telephone Encounter (Signed)
Ok

## 2011-04-12 NOTE — Telephone Encounter (Signed)
Spoke with patient. She has colonoscopy scheduled for tomorrow, 04/12/08. She had diarrhea last night and has been experiencing waves of nausea throughout today. Patient requested to reschedule appointment for  Monday 04/17/11. Did so, and explained how to adjust her prep times. Told her to call us if she needs anything else.

## 2011-04-13 ENCOUNTER — Other Ambulatory Visit: Payer: BC Managed Care – PPO | Admitting: Internal Medicine

## 2011-04-17 ENCOUNTER — Other Ambulatory Visit: Payer: BC Managed Care – PPO | Admitting: Internal Medicine

## 2011-04-17 ENCOUNTER — Encounter: Payer: Self-pay | Admitting: Family Medicine

## 2011-04-17 ENCOUNTER — Ambulatory Visit (AMBULATORY_SURGERY_CENTER): Payer: BC Managed Care – PPO | Admitting: Internal Medicine

## 2011-04-17 ENCOUNTER — Encounter: Payer: Self-pay | Admitting: Internal Medicine

## 2011-04-17 VITALS — BP 187/92 | HR 111 | Temp 98.6°F | Resp 28 | Ht 63.5 in | Wt 162.0 lb

## 2011-04-17 DIAGNOSIS — D126 Benign neoplasm of colon, unspecified: Secondary | ICD-10-CM

## 2011-04-17 DIAGNOSIS — K648 Other hemorrhoids: Secondary | ICD-10-CM

## 2011-04-17 DIAGNOSIS — Z1211 Encounter for screening for malignant neoplasm of colon: Secondary | ICD-10-CM

## 2011-04-17 DIAGNOSIS — K5730 Diverticulosis of large intestine without perforation or abscess without bleeding: Secondary | ICD-10-CM

## 2011-04-17 MED ORDER — SODIUM CHLORIDE 0.9 % IV SOLN: 500.0000 mL | INTRAVENOUS | Status: DC

## 2011-04-17 NOTE — Op Note (Signed)
[facility] [address] [place], [place]  [address]  COLONOSCOPY PROCEDURE REPORT  PATIENT:  Amy Zimmerman, Amy Zimmerman  MR#:  [id number] BIRTHDATE:  09-03-1950, 60 yrs. old  GENDER:  female ENDOSCOPIST:  Iva Boop, MD, [facility] REF. BY:  Loreen Freud, DO PROCEDURE DATE:  04/17/2011 PROCEDURE:  Colonoscopy with snare polypectomy ASA CLASS:  Class II INDICATIONS:  Routine Risk Screening MEDICATIONS:   These medications were titrated to patient response per physician's verbal order, Fentanyl 100 mcg IV, Versed 10 mg IV  DESCRIPTION OF PROCEDURE:   After the risks benefits and alternatives of the procedure were thoroughly explained, informed consent was obtained.  Digital rectal exam was performed and revealed no abnormalities.   The LB 180AL [id number] endoscope was introduced through the anus and advanced to the cecum, which was identified by both the appendix and ileocecal valve, without limitations.  The quality of the prep was excellent, using MoviPrep.  The instrument was then slowly withdrawn as the colon was fully examined. <<PROCEDUREIMAGES>>  FINDINGS:  Two polyps were found. Cecal polyp (flat 6-7 mm) and sigmoid polyp (5mm) removed with cold snare and sent to pathology. Severe diverticulosis was found in the sigmoid colon.  This was otherwise a normal examination of the colon.   Retroflexed views in the rectum revealed internal hemorrhoids.    The time to cecum = 5:09 minutes. The scope was then withdrawn in 10:23 minutes from the cecum and the procedure completed. COMPLICATIONS:  None ENDOSCOPIC IMPRESSION: 1) Two polyps removed 2) Severe diverticulosis in the sigmoid colon 3) Internal hemorrhoids 4) Otherwise normal examination, excellent prep  REPEAT EXAM:  In for Colonoscopy, pending biopsy results.  Iva Boop, MD, [name]  CC:  The Patient and Lelon Perla, DO  n. eSIGNED:   Iva Boop at 04/17/2011 03:40 PM  Judeth Cornfield, [phone]

## 2011-04-17 NOTE — Patient Instructions (Addendum)
Two small polyps were removed. They look benign. I will let you know after I get the pathology report. You also have diverticulosis and hemorrhoids. Please read the information provided. Iva Boop, MD, [name] FOLLOW DISCHARGE INSTRUCTIONS [facility] AND GREEN SHEETS).Marland Kitchen

## 2011-04-17 NOTE — Progress Notes (Signed)
Patient did not experience any of the following events: a burn prior to discharge; a fall within the facility; wrong site/side/patient/procedure/implant event; or a hospital transfer or hospital admission upon discharge from the facility. (G8907) Patient did not have preoperative order for IV antibiotic SSI prophylaxis. (G8918)  

## 2011-04-18 ENCOUNTER — Telehealth: Payer: Self-pay | Admitting: *Deleted

## 2011-04-18 NOTE — Telephone Encounter (Signed)
  Follow up Call-  Call back number 04/17/2011  Post procedure Call Back phone  # [phone]  Permission to leave phone message Yes     Patient questions:  Do you have a fever, pain , or abdominal swelling? Pain Score  0 *  Have you tolerated food without any problems?   Have you been able to return to your normal activities? yes  Do you have any questions about your discharge instructions: Diet   no Medications  no Follow up visit  no  Do you have questions or concerns about your Care? no  Actions: * If pain score is 4 or above: No action needed, pain <4.

## 2011-04-26 ENCOUNTER — Encounter: Payer: Self-pay | Admitting: Internal Medicine

## 2011-04-26 NOTE — Progress Notes (Signed)
Quick Note:  2 serrated adenomas (<1cm)  Repeat colonoscopy about 5 years 04/2016 ______

## 2011-04-27 NOTE — Telephone Encounter (Signed)
SEE OTHER NOTE FROM 04/12/2011

## 2012-03-13 ENCOUNTER — Telehealth: Payer: Self-pay

## 2012-03-13 DIAGNOSIS — I10 Essential (primary) hypertension: Secondary | ICD-10-CM

## 2012-03-13 MED ORDER — HYDROCHLOROTHIAZIDE 50 MG PO TABS: 50.0000 mg | ORAL_TABLET | Freq: Every day | ORAL | Status: DC

## 2012-03-13 MED ORDER — METOPROLOL SUCCINATE ER 100 MG PO TB24: 100.0000 mg | ORAL_TABLET | Freq: Every day | ORAL | Status: DC

## 2012-03-13 NOTE — Telephone Encounter (Signed)
Refill request on BP meds-- Apt pending---Rx faxed      KP

## 2012-04-20 ENCOUNTER — Other Ambulatory Visit: Payer: Self-pay

## 2012-05-01 ENCOUNTER — Other Ambulatory Visit: Payer: Self-pay

## 2012-05-01 DIAGNOSIS — Z1231 Encounter for screening mammogram for malignant neoplasm of breast: Secondary | ICD-10-CM

## 2012-05-17 ENCOUNTER — Encounter: Payer: Self-pay | Admitting: Family Medicine

## 2012-05-17 ENCOUNTER — Ambulatory Visit (INDEPENDENT_AMBULATORY_CARE_PROVIDER_SITE_OTHER): Payer: BC Managed Care – PPO | Admitting: Family Medicine

## 2012-05-17 VITALS — BP 132/80 | HR 82 | Temp 98.3°F | Ht 62.25 in | Wt 158.4 lb

## 2012-05-17 DIAGNOSIS — I10 Essential (primary) hypertension: Secondary | ICD-10-CM

## 2012-05-17 DIAGNOSIS — E039 Hypothyroidism, unspecified: Secondary | ICD-10-CM

## 2012-05-17 DIAGNOSIS — L259 Unspecified contact dermatitis, unspecified cause: Secondary | ICD-10-CM

## 2012-05-17 DIAGNOSIS — Z0000 Encounter for general adult medical examination without abnormal findings: Secondary | ICD-10-CM

## 2012-05-17 DIAGNOSIS — L309 Dermatitis, unspecified: Secondary | ICD-10-CM

## 2012-05-17 DIAGNOSIS — E785 Hyperlipidemia, unspecified: Secondary | ICD-10-CM

## 2012-05-17 LAB — POCT URINALYSIS DIPSTICK
Nitrite, UA: NEGATIVE
Protein, UA: NEGATIVE
Spec Grav, UA: <=1.005
Urobilinogen, UA: 0.2

## 2012-05-17 LAB — CBC WITH DIFFERENTIAL/PLATELET
Basophils Absolute: 0 10*3/uL (ref 0.0–0.1)
Eosinophils Absolute: 0.2 10*3/uL (ref 0.0–0.7)
Lymphocytes Relative: 40.3 % (ref 12.0–46.0)
MCHC: 34.1 g/dL (ref 30.0–36.0)
Monocytes Relative: 6.2 % (ref 3.0–12.0)
Neutro Abs: 3 10*3/uL (ref 1.4–7.7)
Neutrophils Relative %: 49.5 % (ref 43.0–77.0)
Platelets: 218 10*3/uL (ref 150.0–400.0)
RDW: 13.3 % (ref 11.5–14.6)

## 2012-05-17 LAB — LIPID PANEL
HDL: 46.2 mg/dL (ref >39.00)
Total CHOL/HDL Ratio: 6
VLDL: 33.4 mg/dL (ref 0.0–40.0)

## 2012-05-17 LAB — HEPATIC FUNCTION PANEL
Albumin: 4.1 g/dL (ref 3.5–5.2)
Alkaline Phosphatase: 63 U/L (ref 39–117)
Bilirubin, Direct: 0.1 mg/dL (ref 0.0–0.3)
Total Bilirubin: 0.8 mg/dL (ref 0.3–1.2)

## 2012-05-17 LAB — BASIC METABOLIC PANEL
BUN: 13 mg/dL (ref 6–23)
CO2: 28 mEq/L (ref 19–32)
Calcium: 9.7 mg/dL (ref 8.4–10.5)
Creatinine, Ser: 0.8 mg/dL (ref 0.4–1.2)
GFR: 77.71 mL/min (ref >60.00)
Glucose, Bld: 98 mg/dL (ref 70–99)

## 2012-05-17 LAB — TSH: TSH: 1.58 u[IU]/mL (ref 0.35–5.50)

## 2012-05-17 MED ORDER — MOMETASONE FUROATE 0.1 % EX CREA: TOPICAL_CREAM | Freq: Every day | CUTANEOUS | Status: DC

## 2012-05-17 NOTE — Assessment & Plan Note (Signed)
Check labs 

## 2012-05-17 NOTE — Assessment & Plan Note (Signed)
Check labs con't meds 

## 2012-05-17 NOTE — Progress Notes (Signed)
Subjective:     TYSHELL RAMBERG is a 62 y.o. female and is here for a comprehensive physical exam. The patient reports no problems.  History   Social History  . Marital Status: Married    Spouse Name: N/A    Number of Children: N/A  . Years of Education: N/A   Occupational History  . [employer]    Social History Main Topics  . Smoking status: Never Smoker   . Smokeless tobacco: Never Used  . Alcohol Use: No  . Drug Use: No  . Sexually Active: Yes -- Female partner(s)   Other Topics Concern  . Not on file   Social History Narrative   Exercising--  Not regular   Health Maintenance  Topic Date Due  . Influenza Vaccine  11/05/2011  . Zostavax  02/28/2012  . Mammogram  03/20/2013  . Pap Smear  03/08/2014  . Colonoscopy  04/16/2016  . Tetanus/tdap  03/08/2021    The following portions of the patient's history were reviewed and updated as appropriate:  She  has a past medical history of Hypertension. She  does not have any pertinent problems on file. She  has past surgical history that includes uterine polyps and Colonoscopy. Her family history includes Heart disease in her mother; Heart disease (age of onset: 65) in her father; Hyperlipidemia in her sisters; Hypertension in her mother; and Kidney disease in her mother.  There is no history of Colon cancer and Stomach cancer. She  reports that she has never smoked. She has never used smokeless tobacco. She reports that she does not drink alcohol or use illicit drugs. She has a current medication list which includes the following prescription(s): aspirin, calcium carbonate, hydrochlorothiazide, and metoprolol succinate. Current Outpatient Prescriptions on File Prior to Visit  Medication Sig Dispense Refill  . aspirin 81 MG tablet Take 160 mg by mouth daily.        . calcium carbonate (OS-CAL) 600 MG TABS Take 600 mg by mouth 2 (two) times daily with a meal.        . hydrochlorothiazide (HYDRODIURIL) 50 MG  tablet Take 1 tablet (50 mg total) by mouth daily.  90 tablet  0  . metoprolol succinate (TOPROL-XL) 100 MG 24 hr tablet Take 1 tablet (100 mg total) by mouth daily.  90 tablet  0   No current facility-administered medications on file prior to visit.   She has No Known Allergies..  Review of Systems Review of Systems  Constitutional: Negative for activity change, appetite change and fatigue.  HENT: Negative for hearing loss, congestion, tinnitus and ear discharge.  dentist q60m Eyes: Negative for visual disturbance (see optho q1y -- vision corrected to 20/20 with glasses).  Respiratory: Negative for cough, chest tightness and shortness of breath.   Cardiovascular: Negative for chest pain, palpitations and leg swelling.  Gastrointestinal: Negative for abdominal pain, diarrhea, constipation and abdominal distention.  Genitourinary: Negative for urgency, frequency, decreased urine volume and difficulty urinating.  Musculoskeletal: Negative for back pain, arthralgias and gait problem.  Skin: Negative for color change, pallor and rash.  Neurological: Negative for dizziness, light-headedness, numbness and headaches.  Hematological: Negative for adenopathy. Does not bruise/bleed easily.  Psychiatric/Behavioral: Negative for suicidal ideas, confusion, sleep disturbance, self-injury, dysphoric mood, decreased concentration and agitation.      Objective:    BP 132/80  Pulse 82  Temp(Src) 98.3 F (36.8 C) (Oral)  Ht 5' 2.25" (1.581 m)  Wt 158 lb 6.4 oz (71.85 kg)  BMI 28.75 kg/m2  SpO2 97% General appearance: alert, cooperative, appears stated age and no distress Head: Normocephalic, without obvious abnormality, atraumatic Eyes: conjunctivae/corneas clear. PERRL, EOM&#39;s intact. Fundi benign. Ears: normal TM&#39;s and external ear canals both ears Nose: Nares normal. Septum midline. Mucosa normal. No drainage or sinus tenderness. Throat: lips, mucosa, and tongue normal; teeth and gums  normal Neck: no adenopathy, no carotid bruit, no JVD, supple, symmetrical, trachea midline and thyroid not enlarged, symmetric, no tenderness/mass/nodules Back: symmetric, no curvature. ROM normal. No CVA tenderness. Lungs: clear to auscultation bilaterally Breasts: normal appearance, no masses or tenderness Heart: regular rate and rhythm, S1, S2 normal, no murmur, click, rub or gallop Abdomen: soft, non-tender; bowel sounds normal; no masses,  no organomegaly Pelvic: deferred Extremities: extremities normal, atraumatic, no cyanosis or edema Pulses: 2+ and symmetric Skin: + dry scaly and errythematous patch on back of neck Lymph nodes: Cervical, supraclavicular, and axillary nodes normal. Neurologic: Alert and oriented X 3, normal strength and tone. Normal symmetric reflexes. Normal coordination and gait Psych-- no depression, no anxiety      Assessment:    Healthy female exam.      Plan:    ghm utd Check labs See After Visit Summary for Counseling Recommendations

## 2012-05-17 NOTE — Patient Instructions (Signed)
Preventive Care for Adults, Female A healthy lifestyle and preventive care can promote health and wellness. Preventive health guidelines for women include the following key practices.  A routine yearly physical is a good way to check with your caregiver about your health and preventive screening. It is a chance to share any concerns and updates on your health, and to receive a thorough exam.  Visit your dentist for a routine exam and preventive care every 6 months. Brush your teeth twice a day and floss once a day. Good oral hygiene prevents tooth decay and gum disease.  The frequency of eye exams is based on your age, health, family medical history, use of contact lenses, and other factors. Follow your caregiver's recommendations for frequency of eye exams.  Eat a healthy diet. Foods like vegetables, fruits, whole grains, low-fat dairy products, and lean protein foods contain the nutrients you need without too many calories. Decrease your intake of foods high in solid fats, added sugars, and salt. Eat the right amount of calories for you.Get information about a proper diet from your caregiver, if necessary.  Regular physical exercise is one of the most important things you can do for your health. Most adults should get at least 150 minutes of moderate-intensity exercise (any activity that increases your heart rate and causes you to sweat) each week. In addition, most adults need muscle-strengthening exercises on 2 or more days a week.  Maintain a healthy weight. The body mass index (BMI) is a screening tool to identify possible weight problems. It provides an estimate of body fat based on height and weight. Your caregiver can help determine your BMI, and can help you achieve or maintain a healthy weight.For adults 20 years and older:  A BMI below 18.5 is considered underweight.  A BMI of 18.5 to 24.9 is normal.  A BMI of 25 to 29.9 is considered overweight.  A BMI of 30 and above is  considered obese.  Maintain normal blood lipids and cholesterol levels by exercising and minimizing your intake of saturated fat. Eat a balanced diet with plenty of fruit and vegetables. Blood tests for lipids and cholesterol should begin at age 20 and be repeated every 5 years. If your lipid or cholesterol levels are high, you are over 50, or you are at high risk for heart disease, you may need your cholesterol levels checked more frequently.Ongoing high lipid and cholesterol levels should be treated with medicines if diet and exercise are not effective.  If you smoke, find out from your caregiver how to quit. If you do not use tobacco, do not start.  If you are pregnant, do not drink alcohol. If you are breastfeeding, be very cautious about drinking alcohol. If you are not pregnant and choose to drink alcohol, do not exceed 1 drink per day. One drink is considered to be 12 ounces (355 mL) of beer, 5 ounces (148 mL) of wine, or 1.5 ounces (44 mL) of liquor.  Avoid use of street drugs. Do not share needles with anyone. Ask for help if you need support or instructions about stopping the use of drugs.  High blood pressure causes heart disease and increases the risk of stroke. Your blood pressure should be checked at least every 1 to 2 years. Ongoing high blood pressure should be treated with medicines if weight loss and exercise are not effective.  If you are 55 to 61 years old, ask your caregiver if you should take aspirin to prevent strokes.  Diabetes   screening involves taking a blood sample to check your fasting blood sugar level. This should be done once every 3 years, after age 45, if you are within normal weight and without risk factors for diabetes. Testing should be considered at a younger age or be carried out more frequently if you are overweight and have at least 1 risk factor for diabetes.  Breast cancer screening is essential preventive care for women. You should practice "breast  self-awareness." This means understanding the normal appearance and feel of your breasts and may include breast self-examination. Any changes detected, no matter how small, should be reported to a caregiver. Women in their 20s and 30s should have a clinical breast exam (CBE) by a caregiver as part of a regular health exam every 1 to 3 years. After age 40, women should have a CBE every year. Starting at age 40, women should consider having a mammography (breast X-ray test) every year. Women who have a family history of breast cancer should talk to their caregiver about genetic screening. Women at a high risk of breast cancer should talk to their caregivers about having magnetic resonance imaging (MRI) and a mammography every year.  The Pap test is a screening test for cervical cancer. A Pap test can show cell changes on the cervix that might become cervical cancer if left untreated. A Pap test is a procedure in which cells are obtained and examined from the lower end of the uterus (cervix).  Women should have a Pap test starting at age 21.  Between ages 21 and 29, Pap tests should be repeated every 2 years.  Beginning at age 30, you should have a Pap test every 3 years as long as the past 3 Pap tests have been normal.  Some women have medical problems that increase the chance of getting cervical cancer. Talk to your caregiver about these problems. It is especially important to talk to your caregiver if a new problem develops soon after your last Pap test. In these cases, your caregiver may recommend more frequent screening and Pap tests.  The above recommendations are the same for women who have or have not gotten the vaccine for human papillomavirus (HPV).  If you had a hysterectomy for a problem that was not cancer or a condition that could lead to cancer, then you no longer need Pap tests. Even if you no longer need a Pap test, a regular exam is a good idea to make sure no other problems are  starting.  If you are between ages 65 and 70, and you have had normal Pap tests going back 10 years, you no longer need Pap tests. Even if you no longer need a Pap test, a regular exam is a good idea to make sure no other problems are starting.  If you have had past treatment for cervical cancer or a condition that could lead to cancer, you need Pap tests and screening for cancer for at least 20 years after your treatment.  If Pap tests have been discontinued, risk factors (such as a new sexual partner) need to be reassessed to determine if screening should be resumed.  The HPV test is an additional test that may be used for cervical cancer screening. The HPV test looks for the virus that can cause the cell changes on the cervix. The cells collected during the Pap test can be tested for HPV. The HPV test could be used to screen women aged 30 years and older, and should   be used in women of any age who have unclear Pap test results. After the age of 30, women should have HPV testing at the same frequency as a Pap test.  Colorectal cancer can be detected and often prevented. Most routine colorectal cancer screening begins at the age of 50 and continues through age 75. However, your caregiver may recommend screening at an earlier age if you have risk factors for colon cancer. On a yearly basis, your caregiver may provide home test kits to check for hidden blood in the stool. Use of a small camera at the end of a tube, to directly examine the colon (sigmoidoscopy or colonoscopy), can detect the earliest forms of colorectal cancer. Talk to your caregiver about this at age 50, when routine screening begins. Direct examination of the colon should be repeated every 5 to 10 years through age 75, unless early forms of pre-cancerous polyps or small growths are found.  Hepatitis C blood testing is recommended for all people born from 1945 through 1965 and any individual with known risks for hepatitis C.  Practice  safe sex. Use condoms and avoid high-risk sexual practices to reduce the spread of sexually transmitted infections (STIs). STIs include gonorrhea, chlamydia, syphilis, trichomonas, herpes, HPV, and human immunodeficiency virus (HIV). Herpes, HIV, and HPV are viral illnesses that have no cure. They can result in disability, cancer, and death. Sexually active women aged 25 and younger should be checked for chlamydia. Older women with new or multiple partners should also be tested for chlamydia. Testing for other STIs is recommended if you are sexually active and at increased risk.  Osteoporosis is a disease in which the bones lose minerals and strength with aging. This can result in serious bone fractures. The risk of osteoporosis can be identified using a bone density scan. Women ages 65 and over and women at risk for fractures or osteoporosis should discuss screening with their caregivers. Ask your caregiver whether you should take a calcium supplement or vitamin D to reduce the rate of osteoporosis.  Menopause can be associated with physical symptoms and risks. Hormone replacement therapy is available to decrease symptoms and risks. You should talk to your caregiver about whether hormone replacement therapy is right for you.  Use sunscreen with sun protection factor (SPF) of 30 or more. Apply sunscreen liberally and repeatedly throughout the day. You should seek shade when your shadow is shorter than you. Protect yourself by wearing long sleeves, pants, a wide-brimmed hat, and sunglasses year round, whenever you are outdoors.  Once a month, do a whole body skin exam, using a mirror to look at the skin on your back. Notify your caregiver of new moles, moles that have irregular borders, moles that are larger than a pencil eraser, or moles that have changed in shape or color.  Stay current with required immunizations.  Influenza. You need a dose every fall (or winter). The composition of the flu vaccine  changes each year, so being vaccinated once is not enough.  Pneumococcal polysaccharide. You need 1 to 2 doses if you smoke cigarettes or if you have certain chronic medical conditions. You need 1 dose at age 65 (or older) if you have never been vaccinated.  Tetanus, diphtheria, pertussis (Tdap, Td). Get 1 dose of Tdap vaccine if you are younger than age 65, are over 65 and have contact with an infant, are a healthcare worker, are pregnant, or simply want to be protected from whooping cough. After that, you need a Td   booster dose every 10 years. Consult your caregiver if you have not had at least 3 tetanus and diphtheria-containing shots sometime in your life or have a deep or dirty wound.  HPV. You need this vaccine if you are a woman age 26 or younger. The vaccine is given in 3 doses over 6 months.  Measles, mumps, rubella (MMR). You need at least 1 dose of MMR if you were born in 1957 or later. You may also need a second dose.  Meningococcal. If you are age 19 to 21 and a first-year college student living in a residence hall, or have one of several medical conditions, you need to get vaccinated against meningococcal disease. You may also need additional booster doses.  Zoster (shingles). If you are age 60 or older, you should get this vaccine.  Varicella (chickenpox). If you have never had chickenpox or you were vaccinated but received only 1 dose, talk to your caregiver to find out if you need this vaccine.  Hepatitis A. You need this vaccine if you have a specific risk factor for hepatitis A virus infection or you simply wish to be protected from this disease. The vaccine is usually given as 2 doses, 6 to 18 months apart.  Hepatitis B. You need this vaccine if you have a specific risk factor for hepatitis B virus infection or you simply wish to be protected from this disease. The vaccine is given in 3 doses, usually over 6 months. Preventive Services / Frequency Ages 19 to 39  Blood  pressure check.** / Every 1 to 2 years.  Lipid and cholesterol check.** / Every 5 years beginning at age 20.  Clinical breast exam.** / Every 3 years for women in their 20s and 30s.  Pap test.** / Every 2 years from ages 21 through 29. Every 3 years starting at age 30 through age 65 or 70 with a history of 3 consecutive normal Pap tests.  HPV screening.** / Every 3 years from ages 30 through ages 65 to 70 with a history of 3 consecutive normal Pap tests.  Hepatitis C blood test.** / For any individual with known risks for hepatitis C.  Skin self-exam. / Monthly.  Influenza immunization.** / Every year.  Pneumococcal polysaccharide immunization.** / 1 to 2 doses if you smoke cigarettes or if you have certain chronic medical conditions.  Tetanus, diphtheria, pertussis (Tdap, Td) immunization. / A one-time dose of Tdap vaccine. After that, you need a Td booster dose every 10 years.  HPV immunization. / 3 doses over 6 months, if you are 26 and younger.  Measles, mumps, rubella (MMR) immunization. / You need at least 1 dose of MMR if you were born in 1957 or later. You may also need a second dose.  Meningococcal immunization. / 1 dose if you are age 19 to 21 and a first-year college student living in a residence hall, or have one of several medical conditions, you need to get vaccinated against meningococcal disease. You may also need additional booster doses.  Varicella immunization.** / Consult your caregiver.  Hepatitis A immunization.** / Consult your caregiver. 2 doses, 6 to 18 months apart.  Hepatitis B immunization.** / Consult your caregiver. 3 doses usually over 6 months. Ages 40 to 64  Blood pressure check.** / Every 1 to 2 years.  Lipid and cholesterol check.** / Every 5 years beginning at age 20.  Clinical breast exam.** / Every year after age 40.  Mammogram.** / Every year beginning at age 40   and continuing for as long as you are in good health. Consult with your  caregiver.  Pap test.** / Every 3 years starting at age 30 through age 65 or 70 with a history of 3 consecutive normal Pap tests.  HPV screening.** / Every 3 years from ages 30 through ages 65 to 70 with a history of 3 consecutive normal Pap tests.  Fecal occult blood test (FOBT) of stool. / Every year beginning at age 50 and continuing until age 75. You may not need to do this test if you get a colonoscopy every 10 years.  Flexible sigmoidoscopy or colonoscopy.** / Every 5 years for a flexible sigmoidoscopy or every 10 years for a colonoscopy beginning at age 50 and continuing until age 75.  Hepatitis C blood test.** / For all people born from 1945 through 1965 and any individual with known risks for hepatitis C.  Skin self-exam. / Monthly.  Influenza immunization.** / Every year.  Pneumococcal polysaccharide immunization.** / 1 to 2 doses if you smoke cigarettes or if you have certain chronic medical conditions.  Tetanus, diphtheria, pertussis (Tdap, Td) immunization.** / A one-time dose of Tdap vaccine. After that, you need a Td booster dose every 10 years.  Measles, mumps, rubella (MMR) immunization. / You need at least 1 dose of MMR if you were born in 1957 or later. You may also need a second dose.  Varicella immunization.** / Consult your caregiver.  Meningococcal immunization.** / Consult your caregiver.  Hepatitis A immunization.** / Consult your caregiver. 2 doses, 6 to 18 months apart.  Hepatitis B immunization.** / Consult your caregiver. 3 doses, usually over 6 months. Ages 65 and over  Blood pressure check.** / Every 1 to 2 years.  Lipid and cholesterol check.** / Every 5 years beginning at age 20.  Clinical breast exam.** / Every year after age 40.  Mammogram.** / Every year beginning at age 40 and continuing for as long as you are in good health. Consult with your caregiver.  Pap test.** / Every 3 years starting at age 30 through age 65 or 70 with a 3  consecutive normal Pap tests. Testing can be stopped between 65 and 70 with 3 consecutive normal Pap tests and no abnormal Pap or HPV tests in the past 10 years.  HPV screening.** / Every 3 years from ages 30 through ages 65 or 70 with a history of 3 consecutive normal Pap tests. Testing can be stopped between 65 and 70 with 3 consecutive normal Pap tests and no abnormal Pap or HPV tests in the past 10 years.  Fecal occult blood test (FOBT) of stool. / Every year beginning at age 50 and continuing until age 75. You may not need to do this test if you get a colonoscopy every 10 years.  Flexible sigmoidoscopy or colonoscopy.** / Every 5 years for a flexible sigmoidoscopy or every 10 years for a colonoscopy beginning at age 50 and continuing until age 75.  Hepatitis C blood test.** / For all people born from 1945 through 1965 and any individual with known risks for hepatitis C.  Osteoporosis screening.** / A one-time screening for women ages 65 and over and women at risk for fractures or osteoporosis.  Skin self-exam. / Monthly.  Influenza immunization.** / Every year.  Pneumococcal polysaccharide immunization.** / 1 dose at age 65 (or older) if you have never been vaccinated.  Tetanus, diphtheria, pertussis (Tdap, Td) immunization. / A one-time dose of Tdap vaccine if you are over   65 and have contact with an infant, are a healthcare worker, or simply want to be protected from whooping cough. After that, you need a Td booster dose every 10 years.  Varicella immunization.** / Consult your caregiver.  Meningococcal immunization.** / Consult your caregiver.  Hepatitis A immunization.** / Consult your caregiver. 2 doses, 6 to 18 months apart.  Hepatitis B immunization.** / Check with your caregiver. 3 doses, usually over 6 months. ** Family history and personal history of risk and conditions may change your caregiver's recommendations. Document Released: 04/18/2001 Document Revised: 05/15/2011  Document Reviewed: 07/18/2010 [employer] Patient Information 2013 [employer].  

## 2012-05-17 NOTE — Assessment & Plan Note (Signed)
Stable con't meds 

## 2012-05-20 ENCOUNTER — Other Ambulatory Visit: Payer: Self-pay

## 2012-05-20 MED ORDER — POTASSIUM CHLORIDE CRYS ER 20 MEQ PO TBCR: 20.0000 meq | EXTENDED_RELEASE_TABLET | Freq: Every day | ORAL | Status: DC

## 2012-05-27 ENCOUNTER — Ambulatory Visit
Admission: RE | Admit: 2012-05-27 | Discharge: 2012-05-27 | Disposition: A | Discharge status: Home / Self Care | Payer: BC Managed Care – PPO | Source: Ambulatory Visit

## 2012-05-27 DIAGNOSIS — Z1231 Encounter for screening mammogram for malignant neoplasm of breast: Secondary | ICD-10-CM

## 2012-06-01 ENCOUNTER — Other Ambulatory Visit: Payer: Self-pay | Admitting: Family Medicine

## 2012-08-27 ENCOUNTER — Other Ambulatory Visit: Payer: Self-pay

## 2012-08-27 MED ORDER — POTASSIUM CHLORIDE CRYS ER 20 MEQ PO TBCR: EXTENDED_RELEASE_TABLET | ORAL | Status: DC

## 2012-11-15 ENCOUNTER — Encounter: Payer: Self-pay | Admitting: Family Medicine

## 2012-11-21 ENCOUNTER — Other Ambulatory Visit (INDEPENDENT_AMBULATORY_CARE_PROVIDER_SITE_OTHER): Payer: BC Managed Care – PPO

## 2012-11-21 ENCOUNTER — Telehealth: Payer: Self-pay | Admitting: Family Medicine

## 2012-11-21 DIAGNOSIS — E785 Hyperlipidemia, unspecified: Secondary | ICD-10-CM

## 2012-11-21 LAB — HEPATIC FUNCTION PANEL
ALT: 20 U/L (ref 0–35)
Bilirubin, Direct: 0.1 mg/dL (ref 0.0–0.3)
Total Bilirubin: 0.6 mg/dL (ref 0.3–1.2)

## 2012-11-21 LAB — BASIC METABOLIC PANEL
BUN: 16 mg/dL (ref 6–23)
Chloride: 98 mEq/L (ref 96–112)
Glucose, Bld: 89 mg/dL (ref 70–99)
Potassium: 3.8 mEq/L (ref 3.5–5.1)

## 2012-11-21 LAB — LIPID PANEL
HDL: 49.7 mg/dL (ref >39.00)
VLDL: 39.8 mg/dL (ref 0.0–40.0)

## 2012-11-21 MED ORDER — METOPROLOL SUCCINATE ER 100 MG PO TB24: 100.0000 mg | ORAL_TABLET | Freq: Every day | ORAL | Status: DC

## 2012-11-21 NOTE — Telephone Encounter (Signed)
Patient is requesting refill of Metoprolol ER 100mg  Tab Last OV 05/17/12. Last filled 08/27/12 #90

## 2012-11-21 NOTE — Telephone Encounter (Addendum)
Patient is requesting refill on Hydrochlorot 50mg  Tab.  Last OV 05/17/12  Last filled 08/27/12 #90  Okay to refill?

## 2012-11-21 NOTE — Telephone Encounter (Signed)
Metoprolol refill  

## 2012-11-28 ENCOUNTER — Other Ambulatory Visit: Payer: Self-pay | Admitting: General Practice

## 2012-11-28 ENCOUNTER — Other Ambulatory Visit: Payer: Self-pay

## 2012-11-28 MED ORDER — ATORVASTATIN CALCIUM 10 MG PO TABS: 10.0000 mg | ORAL_TABLET | Freq: Every day | ORAL | Status: DC

## 2012-11-28 MED ORDER — HYDROCHLOROTHIAZIDE 50 MG PO TABS: ORAL_TABLET | ORAL | Status: DC

## 2012-12-14 ENCOUNTER — Other Ambulatory Visit: Payer: Self-pay | Admitting: Family Medicine

## 2012-12-16 MED ORDER — HYDROCHLOROTHIAZIDE 50 MG PO TABS: ORAL_TABLET | ORAL | Status: DC

## 2013-01-09 ENCOUNTER — Other Ambulatory Visit: Payer: Self-pay

## 2013-06-16 ENCOUNTER — Telehealth: Payer: Self-pay

## 2013-06-16 MED ORDER — HYDROCHLOROTHIAZIDE 50 MG PO TABS: ORAL_TABLET | ORAL | Status: DC

## 2013-06-16 MED ORDER — METOPROLOL SUCCINATE ER 100 MG PO TB24: 100.0000 mg | ORAL_TABLET | Freq: Every day | ORAL | Status: DC

## 2013-06-16 NOTE — Telephone Encounter (Signed)
Patient returned call and requested to cancel appointment and reschedule due to illness.  Appointment rescheduled for 07/18/13 @ 3:30pm.    Patient requested refills for:  Metoprolol succinate (Toprol XL) 1 tab (100mg ) by mouth daily Hydrochlorothiazide (Hydrodiuril) 1 tab (50 mg) by mouth every day  2nd refill Last OV:  05/17/12 Last filled:  Metoprolol - 11/21/12- #90, 1 refill        Hydrochlorothiazide- 12/16/2012 #90, 1 refill Next Appt:  07/18/13 @ 3:30  Ok to fill?

## 2013-06-16 NOTE — Telephone Encounter (Signed)
Left message for call back Non-identifiable   Pap- 03/09/11- normal CCS- 04/17/11- adenomatous polyps, severe diverticulosis, internal hemorrhoids; repeat in 5 years 04/2016 MMG- 05/27/12-negative BD- 03/21/11-osteopenia Flu- 01/17/12 Td- 03/09/11 Z

## 2013-06-16 NOTE — Telephone Encounter (Signed)
Medications filled and sent to [facility].  Patient made aware.  No further questions or concerns voiced.

## 2013-06-16 NOTE — Telephone Encounter (Signed)
Ok to refill x1 each   

## 2013-06-17 ENCOUNTER — Encounter: Payer: BC Managed Care – PPO | Admitting: Family Medicine

## 2013-07-17 ENCOUNTER — Telehealth: Payer: Self-pay

## 2013-07-17 NOTE — Telephone Encounter (Signed)
Left message for call back Identifiable  Pap- 03/09/11- normal  CCS- 04/17/11- adenomatous polyps, severe diverticulosis, internal hemorrhoids; repeat in 5 years 04/2016  MMG- 05/27/12-negative  BD- 03/21/11-osteopenia  Flu- 01/17/12  Td- 03/09/11  Z

## 2013-07-18 ENCOUNTER — Ambulatory Visit (INDEPENDENT_AMBULATORY_CARE_PROVIDER_SITE_OTHER): Payer: BC Managed Care – PPO | Admitting: Family Medicine

## 2013-07-18 ENCOUNTER — Encounter: Payer: Self-pay | Admitting: Family Medicine

## 2013-07-18 VITALS — BP 130/90 | HR 72 | Temp 98.4°F | Ht 62.0 in | Wt 163.8 lb

## 2013-07-18 DIAGNOSIS — D239 Other benign neoplasm of skin, unspecified: Secondary | ICD-10-CM

## 2013-07-18 DIAGNOSIS — Z0000 Encounter for general adult medical examination without abnormal findings: Secondary | ICD-10-CM

## 2013-07-18 DIAGNOSIS — I10 Essential (primary) hypertension: Secondary | ICD-10-CM

## 2013-07-18 DIAGNOSIS — D229 Melanocytic nevi, unspecified: Secondary | ICD-10-CM

## 2013-07-18 DIAGNOSIS — Z1239 Encounter for other screening for malignant neoplasm of breast: Secondary | ICD-10-CM

## 2013-07-18 DIAGNOSIS — E2839 Other primary ovarian failure: Secondary | ICD-10-CM

## 2013-07-18 DIAGNOSIS — E785 Hyperlipidemia, unspecified: Secondary | ICD-10-CM

## 2013-07-18 MED ORDER — HYDROCHLOROTHIAZIDE 50 MG PO TABS: ORAL_TABLET | ORAL | Status: DC

## 2013-07-18 MED ORDER — METOPROLOL SUCCINATE ER 100 MG PO TB24: 100.0000 mg | ORAL_TABLET | Freq: Every day | ORAL | Status: DC

## 2013-07-18 NOTE — Patient Instructions (Signed)

## 2013-07-18 NOTE — Progress Notes (Signed)
Pre visit review using our clinic review tool, if applicable. No additional management support is needed unless otherwise documented below in the visit note. 

## 2013-07-18 NOTE — Progress Notes (Signed)
Subjective:     Amy Zimmerman is a 63 y.o. female and is here for a comprehensive physical exam. The patient reports no problems.  History   Social History  . Marital Status: Married    Spouse Name: N/A    Number of Children: N/A  . Years of Education: N/A   Occupational History  . [employer]    Social History Main Topics  . Smoking status: Never Smoker   . Smokeless tobacco: Never Used  . Alcohol Use: No  . Drug Use: No  . Sexual Activity: Yes    Partners: Male   Other Topics Concern  . Not on file   Social History Narrative   Exercising--  Not regular   Health Maintenance  Topic Date Due  . Zostavax  07/19/2014 (Originally 02/28/2012)  . Influenza Vaccine  10/04/2013  . Pap Smear  03/08/2014  . Mammogram  05/28/2014  . Colonoscopy  04/16/2016  . Tetanus/tdap  03/08/2021    The following portions of the patient's history were reviewed and updated as appropriate:  She  has a past medical history of Hypertension. She  does not have any pertinent problems on file. She  has past surgical history that includes uterine polyps and Colonoscopy. Her family history includes Heart disease in her mother; Heart disease (age of onset: 17) in her father; Hyperlipidemia in her sister and sister; Hypertension in her mother; Kidney disease in her mother. There is no history of Colon cancer or Stomach cancer. She  reports that she has never smoked. She has never used smokeless tobacco. She reports that she does not drink alcohol or use illicit drugs. She has a current medication list which includes the following prescription(s): aspirin, calcium carbonate, hydrochlorothiazide, metoprolol succinate, atorvastatin, and potassium chloride sa. Current Outpatient Prescriptions on File Prior to Visit  Medication Sig Dispense Refill  . aspirin 81 MG tablet Take 160 mg by mouth daily.        . calcium carbonate (OS-CAL) 600 MG TABS Take 600 mg by mouth 2 (two) times daily  with a meal.        . hydrochlorothiazide (HYDRODIURIL) 50 MG tablet TAKE ONE TABLET BY MOUTH EVERY DAY  30 tablet  0  . metoprolol succinate (TOPROL-XL) 100 MG 24 hr tablet Take 1 tablet (100 mg total) by mouth daily. Take with or immediately following a meal.  30 tablet  0  . atorvastatin (LIPITOR) 10 MG tablet Take 1 tablet (10 mg total) by mouth daily.  30 tablet  2  . potassium chloride SA (K-DUR,KLOR-CON) 20 MEQ tablet 1 tab by mouth daily---repeat labs are due now  30 tablet  0   No current facility-administered medications on file prior to visit.   She has No Known Allergies..  Review of Systems Review of Systems  Constitutional: Negative for activity change, appetite change and fatigue.  HENT: Negative for hearing loss, congestion, tinnitus and ear discharge.  dentist q40m Eyes: Negative for visual disturbance (see optho q1y -- vision corrected to 20/20 with glasses).  Respiratory: Negative for cough, chest tightness and shortness of breath.   Cardiovascular: Negative for chest pain, palpitations and leg swelling.  Gastrointestinal: Negative for abdominal pain, diarrhea, constipation and abdominal distention.  Genitourinary: Negative for urgency, frequency, decreased urine volume and difficulty urinating.  Musculoskeletal: Negative for back pain, arthralgias and gait problem.  Skin: Negative for color change, pallor and rash.  Neurological: Negative for dizziness, light-headedness, numbness and headaches.  Hematological: Negative for  adenopathy. Does not bruise/bleed easily.  Psychiatric/Behavioral: Negative for suicidal ideas, confusion, sleep disturbance, self-injury, dysphoric mood, decreased concentration and agitation.       Objective:    BP 130/90  Pulse 72  Temp(Src) 98.4 F (36.9 C) (Oral)  Ht 5\' 2"  (1.575 m)  Wt 163 lb 12.8 oz (74.299 kg)  BMI 29.95 kg/m2  SpO2 95% General appearance: alert, cooperative, appears stated age and no distress Head: Normocephalic,  without obvious abnormality, atraumatic Eyes: conjunctivae/corneas clear. PERRL, EOM's intact. Fundi benign. Ears: normal TM's and external ear canals both ears Nose: Nares normal. Septum midline. Mucosa normal. No drainage or sinus tenderness. Throat: lips, mucosa, and tongue normal; teeth and gums normal Neck: no adenopathy, no carotid bruit, no JVD, supple, symmetrical, trachea midline and thyroid not enlarged, symmetric, no tenderness/mass/nodules Back: symmetric, no curvature. ROM normal. No CVA tenderness. Lungs: clear to auscultation bilaterally Breasts: normal appearance, no masses or tenderness Heart: regular rate and rhythm, S1, S2 normal, no murmur, click, rub or gallop Abdomen: soft, non-tender; bowel sounds normal; no masses,  no organomegaly Pelvic: deferred Extremities: extremities normal, atraumatic, no cyanosis or edema Pulses: 2+ and symmetric Skin: Skin color, texture, turgor normal. No rashes or lesions Lymph nodes: Cervical, supraclavicular, and axillary nodes normal. Neurologic: Alert and oriented X 3, normal strength and tone. Normal symmetric reflexes. Normal coordination and gait Psych-  No depression, no anxiety      Assessment:    Healthy female exam.       Plan:     ghm utd  Check labs See After Visit Summary for Counseling Recommendations   1. Other and unspecified hyperlipidemia Check labs - Hepatic function panel; Future - Lipid panel; Future - POCT urinalysis dipstick; Future - TSH; Future  2. HTN (hypertension) stable - Basic metabolic panel; Future - CBC with Differential; Future - POCT urinalysis dipstick; Future - TSH; Future - metoprolol succinate (TOPROL-XL) 100 MG 24 hr tablet; Take 1 tablet (100 mg total) by mouth daily. Take with or immediately following a meal.  Dispense: 90 tablet; Refill: 3 - hydrochlorothiazide (HYDRODIURIL) 50 MG tablet; TAKE ONE TABLET BY MOUTH EVERY DAY  Dispense: 90 tablet; Refill: 3  3. Numerous  moles  - Ambulatory referral to Dermatology  4. Other screening breast examination  - MM DIGITAL SCREENING BILATERAL; Future  5. Estrogen deficiency  - DG Bone Density; Future  6. Preventative health care

## 2013-07-21 ENCOUNTER — Telehealth: Payer: Self-pay | Admitting: Family Medicine

## 2013-07-21 NOTE — Telephone Encounter (Signed)
Relevant patient education mailed to patient.  

## 2013-07-23 ENCOUNTER — Other Ambulatory Visit (INDEPENDENT_AMBULATORY_CARE_PROVIDER_SITE_OTHER): Payer: BC Managed Care – PPO

## 2013-07-23 DIAGNOSIS — I10 Essential (primary) hypertension: Secondary | ICD-10-CM

## 2013-07-23 DIAGNOSIS — E785 Hyperlipidemia, unspecified: Secondary | ICD-10-CM

## 2013-07-23 LAB — CBC WITH DIFFERENTIAL/PLATELET
BASOS PCT: 0.4 % (ref 0.0–3.0)
Basophils Absolute: 0 10*3/uL (ref 0.0–0.1)
Eosinophils Absolute: 0.2 10*3/uL (ref 0.0–0.7)
Eosinophils Relative: 2.5 % (ref 0.0–5.0)
HEMATOCRIT: 43.6 % (ref 36.0–46.0)
Hemoglobin: 14.5 g/dL (ref 12.0–15.0)
LYMPHS ABS: 3.1 10*3/uL (ref 0.7–4.0)
Lymphocytes Relative: 42.8 % (ref 12.0–46.0)
MCHC: 33.2 g/dL (ref 30.0–36.0)
MCV: 86.8 fl (ref 78.0–100.0)
MONO ABS: 0.5 10*3/uL (ref 0.1–1.0)
MONOS PCT: 7.3 % (ref 3.0–12.0)
NEUTROS PCT: 47 % (ref 43.0–77.0)
Neutro Abs: 3.4 10*3/uL (ref 1.4–7.7)
PLATELETS: 253 10*3/uL (ref 150.0–400.0)
RBC: 5.03 Mil/uL (ref 3.87–5.11)
RDW: 13.2 % (ref 11.5–15.5)
WBC: 7.2 10*3/uL (ref 4.0–10.5)

## 2013-07-23 LAB — BASIC METABOLIC PANEL
BUN: 12 mg/dL (ref 6–23)
CO2: 29 mEq/L (ref 19–32)
Calcium: 9.5 mg/dL (ref 8.4–10.5)
Chloride: 94 mEq/L — ABNORMAL LOW (ref 96–112)
Creatinine, Ser: 0.8 mg/dL (ref 0.4–1.2)
GFR: 77.4 mL/min (ref >60.00)
Glucose, Bld: 82 mg/dL (ref 70–99)
Potassium: 3.1 mEq/L — ABNORMAL LOW (ref 3.5–5.1)
Sodium: 134 mEq/L — ABNORMAL LOW (ref 135–145)

## 2013-07-23 LAB — LIPID PANEL
Cholesterol: 242 mg/dL — ABNORMAL HIGH (ref 0–200)
HDL: 43.2 mg/dL (ref >39.00)
LDL Cholesterol: 154 mg/dL — ABNORMAL HIGH (ref 0–99)
Total CHOL/HDL Ratio: 6
Triglycerides: 224 mg/dL — ABNORMAL HIGH (ref 0.0–149.0)
VLDL: 44.8 mg/dL — ABNORMAL HIGH (ref 0.0–40.0)

## 2013-07-23 LAB — HEPATIC FUNCTION PANEL
ALT: 22 U/L (ref 0–35)
AST: 23 U/L (ref 0–37)
Albumin: 4 g/dL (ref 3.5–5.2)
Alkaline Phosphatase: 59 U/L (ref 39–117)
BILIRUBIN DIRECT: 0 mg/dL (ref 0.0–0.3)
TOTAL PROTEIN: 7.2 g/dL (ref 6.0–8.3)
Total Bilirubin: 0.5 mg/dL (ref 0.2–1.2)

## 2013-07-23 LAB — POCT URINALYSIS DIPSTICK
BILIRUBIN UA: NEGATIVE
Blood, UA: NEGATIVE
GLUCOSE UA: NEGATIVE
Ketones, UA: NEGATIVE
Leukocytes, UA: NEGATIVE
NITRITE UA: NEGATIVE
Protein, UA: NEGATIVE
Spec Grav, UA: <=1.005
UROBILINOGEN UA: 0.2
pH, UA: 7.5

## 2013-07-23 LAB — TSH: TSH: 1.15 u[IU]/mL (ref 0.35–4.50)

## 2013-07-25 NOTE — Telephone Encounter (Signed)
Unable to reach patient pre visit.  

## 2013-07-30 ENCOUNTER — Other Ambulatory Visit: Payer: Self-pay

## 2013-07-30 MED ORDER — ATORVASTATIN CALCIUM 20 MG PO TABS: 20.0000 mg | ORAL_TABLET | Freq: Every day | ORAL | Status: DC

## 2013-08-12 ENCOUNTER — Ambulatory Visit
Admission: RE | Admit: 2013-08-12 | Discharge: 2013-08-12 | Disposition: A | Discharge status: Home / Self Care | Payer: BC Managed Care – PPO | Source: Ambulatory Visit | Attending: Family Medicine | Admitting: Family Medicine

## 2013-08-12 DIAGNOSIS — Z1239 Encounter for other screening for malignant neoplasm of breast: Secondary | ICD-10-CM

## 2013-08-12 DIAGNOSIS — E2839 Other primary ovarian failure: Secondary | ICD-10-CM

## 2014-07-17 ENCOUNTER — Other Ambulatory Visit: Payer: Self-pay

## 2014-07-17 DIAGNOSIS — I10 Essential (primary) hypertension: Secondary | ICD-10-CM

## 2014-07-17 MED ORDER — HYDROCHLOROTHIAZIDE 50 MG PO TABS: ORAL_TABLET | ORAL | Status: DC

## 2014-07-17 MED ORDER — METOPROLOL SUCCINATE ER 100 MG PO TB24
100.0000 mg | ORAL_TABLET | Freq: Every day | ORAL | Status: DC
Start: 2014-07-17 — End: 2014-12-26

## 2014-07-17 NOTE — Addendum Note (Signed)
Addended by: Ewing Schlein on: 07/17/2014 11:16 AM   Modules accepted: Orders

## 2014-08-05 ENCOUNTER — Telehealth: Payer: Self-pay | Admitting: Family Medicine

## 2014-08-05 NOTE — Telephone Encounter (Signed)
Pre Visit letter sent  °

## 2014-08-21 ENCOUNTER — Encounter: Payer: Self-pay | Admitting: Behavioral Health

## 2014-08-21 ENCOUNTER — Telehealth: Payer: Self-pay | Admitting: Behavioral Health

## 2014-08-21 NOTE — Telephone Encounter (Signed)
Pre-visit call completed. Information updated; documented under the Specialty comments.

## 2014-08-24 ENCOUNTER — Other Ambulatory Visit (HOSPITAL_COMMUNITY)
Admission: RE | Admit: 2014-08-24 | Discharge: 2014-08-24 | Disposition: A | Discharge status: Home / Self Care | Payer: BLUE CROSS/BLUE SHIELD | Source: Ambulatory Visit | Attending: Family Medicine | Admitting: Family Medicine

## 2014-08-24 ENCOUNTER — Encounter: Payer: Self-pay | Admitting: Family Medicine

## 2014-08-24 ENCOUNTER — Ambulatory Visit (INDEPENDENT_AMBULATORY_CARE_PROVIDER_SITE_OTHER): Payer: BLUE CROSS/BLUE SHIELD | Admitting: Family Medicine

## 2014-08-24 VITALS — BP 134/78 | HR 74 | Temp 99.0°F | Ht 63.0 in | Wt 141.4 lb

## 2014-08-24 DIAGNOSIS — Z01419 Encounter for gynecological examination (general) (routine) without abnormal findings: Secondary | ICD-10-CM | POA: Diagnosis not present

## 2014-08-24 DIAGNOSIS — Z124 Encounter for screening for malignant neoplasm of cervix: Secondary | ICD-10-CM | POA: Diagnosis not present

## 2014-08-24 DIAGNOSIS — Z0000 Encounter for general adult medical examination without abnormal findings: Secondary | ICD-10-CM

## 2014-08-24 DIAGNOSIS — Z1239 Encounter for other screening for malignant neoplasm of breast: Secondary | ICD-10-CM | POA: Diagnosis not present

## 2014-08-24 DIAGNOSIS — Z1151 Encounter for screening for human papillomavirus (HPV): Secondary | ICD-10-CM | POA: Insufficient documentation

## 2014-08-24 NOTE — Patient Instructions (Signed)
Preventive Care for Adults A healthy lifestyle and preventive care can promote health and wellness. Preventive health guidelines for women include the following key practices.  A routine yearly physical is a good way to check with your health care provider about your health and preventive screening. It is a chance to share any concerns and updates on your health and to receive a thorough exam.  Visit your dentist for a routine exam and preventive care every 6 months. Brush your teeth twice a day and floss once a day. Good oral hygiene prevents tooth decay and gum disease.  The frequency of eye exams is based on your age, health, family medical history, use of contact lenses, and other factors. Follow your health care provider's recommendations for frequency of eye exams.  Eat a healthy diet. Foods like vegetables, fruits, whole grains, low-fat dairy products, and lean protein foods contain the nutrients you need without too many calories. Decrease your intake of foods high in solid fats, added sugars, and salt. Eat the right amount of calories for you.Get information about a proper diet from your health care provider, if necessary.  Regular physical exercise is one of the most important things you can do for your health. Most adults should get at least 150 minutes of moderate-intensity exercise (any activity that increases your heart rate and causes you to sweat) each week. In addition, most adults need muscle-strengthening exercises on 2 or more days a week.  Maintain a healthy weight. The body mass index (BMI) is a screening tool to identify possible weight problems. It provides an estimate of body fat based on height and weight. Your health care provider can find your BMI and can help you achieve or maintain a healthy weight.For adults 20 years and older:  A BMI below 18.5 is considered underweight.  A BMI of 18.5 to 24.9 is normal.  A BMI of 25 to 29.9 is considered overweight.  A BMI of  30 and above is considered obese.  Maintain normal blood lipids and cholesterol levels by exercising and minimizing your intake of saturated fat. Eat a balanced diet with plenty of fruit and vegetables. Blood tests for lipids and cholesterol should begin at age 76 and be repeated every 5 years. If your lipid or cholesterol levels are high, you are over 50, or you are at high risk for heart disease, you may need your cholesterol levels checked more frequently.Ongoing high lipid and cholesterol levels should be treated with medicines if diet and exercise are not working.  If you smoke, find out from your health care provider how to quit. If you do not use tobacco, do not start.  Lung cancer screening is recommended for adults aged 22-80 years who are at high risk for developing lung cancer because of a history of smoking. A yearly low-dose CT scan of the lungs is recommended for people who have at least a 30-pack-year history of smoking and are a current smoker or have quit within the past 15 years. A pack year of smoking is smoking an average of 1 pack of cigarettes a day for 1 year (for example: 1 pack a day for 30 years or 2 packs a day for 15 years). Yearly screening should continue until the smoker has stopped smoking for at least 15 years. Yearly screening should be stopped for people who develop a health problem that would prevent them from having lung cancer treatment.  If you are pregnant, do not drink alcohol. If you are breastfeeding,  be very cautious about drinking alcohol. If you are not pregnant and choose to drink alcohol, do not have more than 1 drink per day. One drink is considered to be 12 ounces (355 mL) of beer, 5 ounces (148 mL) of wine, or 1.5 ounces (44 mL) of liquor.  Avoid use of street drugs. Do not share needles with anyone. Ask for help if you need support or instructions about stopping the use of drugs.  High blood pressure causes heart disease and increases the risk of  stroke. Your blood pressure should be checked at least every 1 to 2 years. Ongoing high blood pressure should be treated with medicines if weight loss and exercise do not work.  If you are 75-52 years old, ask your health care provider if you should take aspirin to prevent strokes.  Diabetes screening involves taking a blood sample to check your fasting blood sugar level. This should be done once every 3 years, after age 15, if you are within normal weight and without risk factors for diabetes. Testing should be considered at a younger age or be carried out more frequently if you are overweight and have at least 1 risk factor for diabetes.  Breast cancer screening is essential preventive care for women. You should practice "breast self-awareness." This means understanding the normal appearance and feel of your breasts and may include breast self-examination. Any changes detected, no matter how small, should be reported to a health care provider. Women in their 58s and 30s should have a clinical breast exam (CBE) by a health care provider as part of a regular health exam every 1 to 3 years. After age 16, women should have a CBE every year. Starting at age 53, women should consider having a mammogram (breast X-ray test) every year. Women who have a family history of breast cancer should talk to their health care provider about genetic screening. Women at a high risk of breast cancer should talk to their health care providers about having an MRI and a mammogram every year.  Breast cancer gene (BRCA)-related cancer risk assessment is recommended for women who have family members with BRCA-related cancers. BRCA-related cancers include breast, ovarian, tubal, and peritoneal cancers. Having family members with these cancers may be associated with an increased risk for harmful changes (mutations) in the breast cancer genes BRCA1 and BRCA2. Results of the assessment will determine the need for genetic counseling and  BRCA1 and BRCA2 testing.  Routine pelvic exams to screen for cancer are no longer recommended for nonpregnant women who are considered low risk for cancer of the pelvic organs (ovaries, uterus, and vagina) and who do not have symptoms. Ask your health care provider if a screening pelvic exam is right for you.  If you have had past treatment for cervical cancer or a condition that could lead to cancer, you need Pap tests and screening for cancer for at least 20 years after your treatment. If Pap tests have been discontinued, your risk factors (such as having a new sexual partner) need to be reassessed to determine if screening should be resumed. Some women have medical problems that increase the chance of getting cervical cancer. In these cases, your health care provider may recommend more frequent screening and Pap tests.  The HPV test is an additional test that may be used for cervical cancer screening. The HPV test looks for the virus that can cause the cell changes on the cervix. The cells collected during the Pap test can be  tested for HPV. The HPV test could be used to screen women aged 30 years and older, and should be used in women of any age who have unclear Pap test results. After the age of 30, women should have HPV testing at the same frequency as a Pap test.  Colorectal cancer can be detected and often prevented. Most routine colorectal cancer screening begins at the age of 50 years and continues through age 75 years. However, your health care provider may recommend screening at an earlier age if you have risk factors for colon cancer. On a yearly basis, your health care provider may provide home test kits to check for hidden blood in the stool. Use of a small camera at the end of a tube, to directly examine the colon (sigmoidoscopy or colonoscopy), can detect the earliest forms of colorectal cancer. Talk to your health care provider about this at age 50, when routine screening begins. Direct  exam of the colon should be repeated every 5-10 years through age 75 years, unless early forms of pre-cancerous polyps or small growths are found.  People who are at an increased risk for hepatitis B should be screened for this virus. You are considered at high risk for hepatitis B if:  You were born in a country where hepatitis B occurs often. Talk with your health care provider about which countries are considered high risk.  Your parents were born in a high-risk country and you have not received a shot to protect against hepatitis B (hepatitis B vaccine).  You have HIV or AIDS.  You use needles to inject street drugs.  You live with, or have sex with, someone who has hepatitis B.  You get hemodialysis treatment.  You take certain medicines for conditions like cancer, organ transplantation, and autoimmune conditions.  Hepatitis C blood testing is recommended for all people born from 1945 through 1965 and any individual with known risks for hepatitis C.  Practice safe sex. Use condoms and avoid high-risk sexual practices to reduce the spread of sexually transmitted infections (STIs). STIs include gonorrhea, chlamydia, syphilis, trichomonas, herpes, HPV, and human immunodeficiency virus (HIV). Herpes, HIV, and HPV are viral illnesses that have no cure. They can result in disability, cancer, and death.  You should be screened for sexually transmitted illnesses (STIs) including gonorrhea and chlamydia if:  You are sexually active and are younger than 24 years.  You are older than 24 years and your health care provider tells you that you are at risk for this type of infection.  Your sexual activity has changed since you were last screened and you are at an increased risk for chlamydia or gonorrhea. Ask your health care provider if you are at risk.  If you are at risk of being infected with HIV, it is recommended that you take a prescription medicine daily to prevent HIV infection. This is  called preexposure prophylaxis (PrEP). You are considered at risk if:  You are a heterosexual woman, are sexually active, and are at increased risk for HIV infection.  You take drugs by injection.  You are sexually active with a partner who has HIV.  Talk with your health care provider about whether you are at high risk of being infected with HIV. If you choose to begin PrEP, you should first be tested for HIV. You should then be tested every 3 months for as long as you are taking PrEP.  Osteoporosis is a disease in which the bones lose minerals and strength   with aging. This can result in serious bone fractures or breaks. The risk of osteoporosis can be identified using a bone density scan. Women ages 65 years and over and women at risk for fractures or osteoporosis should discuss screening with their health care providers. Ask your health care provider whether you should take a calcium supplement or vitamin D to reduce the rate of osteoporosis.  Menopause can be associated with physical symptoms and risks. Hormone replacement therapy is available to decrease symptoms and risks. You should talk to your health care provider about whether hormone replacement therapy is right for you.  Use sunscreen. Apply sunscreen liberally and repeatedly throughout the day. You should seek shade when your shadow is shorter than you. Protect yourself by wearing long sleeves, pants, a wide-brimmed hat, and sunglasses year round, whenever you are outdoors.  Once a month, do a whole body skin exam, using a mirror to look at the skin on your back. Tell your health care provider of new moles, moles that have irregular borders, moles that are larger than a pencil eraser, or moles that have changed in shape or color.  Stay current with required vaccines (immunizations).  Influenza vaccine. All adults should be immunized every year.  Tetanus, diphtheria, and acellular pertussis (Td, Tdap) vaccine. Pregnant women should  receive 1 dose of Tdap vaccine during each pregnancy. The dose should be obtained regardless of the length of time since the last dose. Immunization is preferred during the 27th-36th week of gestation. An adult who has not previously received Tdap or who does not know her vaccine status should receive 1 dose of Tdap. This initial dose should be followed by tetanus and diphtheria toxoids (Td) booster doses every 10 years. Adults with an unknown or incomplete history of completing a 3-dose immunization series with Td-containing vaccines should begin or complete a primary immunization series including a Tdap dose. Adults should receive a Td booster every 10 years.  Varicella vaccine. An adult without evidence of immunity to varicella should receive 2 doses or a second dose if she has previously received 1 dose. Pregnant females who do not have evidence of immunity should receive the first dose after pregnancy. This first dose should be obtained before leaving the health care facility. The second dose should be obtained 4-8 weeks after the first dose.  Human papillomavirus (HPV) vaccine. Females aged 13-26 years who have not received the vaccine previously should obtain the 3-dose series. The vaccine is not recommended for use in pregnant females. However, pregnancy testing is not needed before receiving a dose. If a female is found to be pregnant after receiving a dose, no treatment is needed. In that case, the remaining doses should be delayed until after the pregnancy. Immunization is recommended for any person with an immunocompromised condition through the age of 26 years if she did not get any or all doses earlier. During the 3-dose series, the second dose should be obtained 4-8 weeks after the first dose. The third dose should be obtained 24 weeks after the first dose and 16 weeks after the second dose.  Zoster vaccine. One dose is recommended for adults aged 60 years or older unless certain conditions are  present.  Measles, mumps, and rubella (MMR) vaccine. Adults born before 1957 generally are considered immune to measles and mumps. Adults born in 1957 or later should have 1 or more doses of MMR vaccine unless there is a contraindication to the vaccine or there is laboratory evidence of immunity to   each of the three diseases. A routine second dose of MMR vaccine should be obtained at least 28 days after the first dose for students attending postsecondary schools, health care workers, or international travelers. People who received inactivated measles vaccine or an unknown type of measles vaccine during 1963-1967 should receive 2 doses of MMR vaccine. People who received inactivated mumps vaccine or an unknown type of mumps vaccine before 1979 and are at high risk for mumps infection should consider immunization with 2 doses of MMR vaccine. For females of childbearing age, rubella immunity should be determined. If there is no evidence of immunity, females who are not pregnant should be vaccinated. If there is no evidence of immunity, females who are pregnant should delay immunization until after pregnancy. Unvaccinated health care workers born before 1957 who lack laboratory evidence of measles, mumps, or rubella immunity or laboratory confirmation of disease should consider measles and mumps immunization with 2 doses of MMR vaccine or rubella immunization with 1 dose of MMR vaccine.  Pneumococcal 13-valent conjugate (PCV13) vaccine. When indicated, a person who is uncertain of her immunization history and has no record of immunization should receive the PCV13 vaccine. An adult aged 19 years or older who has certain medical conditions and has not been previously immunized should receive 1 dose of PCV13 vaccine. This PCV13 should be followed with a dose of pneumococcal polysaccharide (PPSV23) vaccine. The PPSV23 vaccine dose should be obtained at least 8 weeks after the dose of PCV13 vaccine. An adult aged 19  years or older who has certain medical conditions and previously received 1 or more doses of PPSV23 vaccine should receive 1 dose of PCV13. The PCV13 vaccine dose should be obtained 1 or more years after the last PPSV23 vaccine dose.  Pneumococcal polysaccharide (PPSV23) vaccine. When PCV13 is also indicated, PCV13 should be obtained first. All adults aged 65 years and older should be immunized. An adult younger than age 65 years who has certain medical conditions should be immunized. Any person who resides in a nursing home or long-term care facility should be immunized. An adult smoker should be immunized. People with an immunocompromised condition and certain other conditions should receive both PCV13 and PPSV23 vaccines. People with human immunodeficiency virus (HIV) infection should be immunized as soon as possible after diagnosis. Immunization during chemotherapy or radiation therapy should be avoided. Routine use of PPSV23 vaccine is not recommended for American Indians, [place] Natives, or people younger than 65 years unless there are medical conditions that require PPSV23 vaccine. When indicated, people who have unknown immunization and have no record of immunization should receive PPSV23 vaccine. One-time revaccination 5 years after the first dose of PPSV23 is recommended for people aged 19-64 years who have chronic kidney failure, nephrotic syndrome, asplenia, or immunocompromised conditions. People who received 1-2 doses of PPSV23 before age 65 years should receive another dose of PPSV23 vaccine at age 65 years or later if at least 5 years have passed since the previous dose. Doses of PPSV23 are not needed for people immunized with PPSV23 at or after age 65 years.  Meningococcal vaccine. Adults with asplenia or persistent complement component deficiencies should receive 2 doses of quadrivalent meningococcal conjugate (MenACWY-D) vaccine. The doses should be obtained at least 2 months apart.  Microbiologists working with certain meningococcal bacteria, military recruits, people at risk during an outbreak, and people who travel to or live in countries with a high rate of meningitis should be immunized. A first-year college student up through age   21 years who is living in a residence hall should receive a dose if she did not receive a dose on or after her 16th birthday. Adults who have certain high-risk conditions should receive one or more doses of vaccine.  Hepatitis A vaccine. Adults who wish to be protected from this disease, have certain high-risk conditions, work with hepatitis A-infected animals, work in hepatitis A research labs, or travel to or work in countries with a high rate of hepatitis A should be immunized. Adults who were previously unvaccinated and who anticipate close contact with an international adoptee during the first 60 days after arrival in the [place] from a country with a high rate of hepatitis A should be immunized.  Hepatitis B vaccine. Adults who wish to be protected from this disease, have certain high-risk conditions, may be exposed to blood or other infectious body fluids, are household contacts or sex partners of hepatitis B positive people, are clients or workers in certain care facilities, or travel to or work in countries with a high rate of hepatitis B should be immunized.  Haemophilus influenzae type b (Hib) vaccine. A previously unvaccinated person with asplenia or sickle cell disease or having a scheduled splenectomy should receive 1 dose of Hib vaccine. Regardless of previous immunization, a recipient of a hematopoietic stem cell transplant should receive a 3-dose series 6-12 months after her successful transplant. Hib vaccine is not recommended for adults with HIV infection. Preventive Services / Frequency Ages 64 to 68 years  Blood pressure check.** / Every 1 to 2 years.  Lipid and cholesterol check.** / Every 5 years beginning at age  22.  Clinical breast exam.** / Every 3 years for women in their 88s and 53s.  BRCA-related cancer risk assessment.** / For women who have family members with a BRCA-related cancer (breast, ovarian, tubal, or peritoneal cancers).  Pap test.** / Every 2 years from ages 90 through 51. Every 3 years starting at age 21 through age 56 or 3 with a history of 3 consecutive normal Pap tests.  HPV screening.** / Every 3 years from ages 24 through ages 1 to 46 with a history of 3 consecutive normal Pap tests.  Hepatitis C blood test.** / For any individual with known risks for hepatitis C.  Skin self-exam. / Monthly.  Influenza vaccine. / Every year.  Tetanus, diphtheria, and acellular pertussis (Tdap, Td) vaccine.** / Consult your health care provider. Pregnant women should receive 1 dose of Tdap vaccine during each pregnancy. 1 dose of Td every 10 years.  Varicella vaccine.** / Consult your health care provider. Pregnant females who do not have evidence of immunity should receive the first dose after pregnancy.  HPV vaccine. / 3 doses over 6 months, if 72 and younger. The vaccine is not recommended for use in pregnant females. However, pregnancy testing is not needed before receiving a dose.  Measles, mumps, rubella (MMR) vaccine.** / You need at least 1 dose of MMR if you were born in 1957 or later. You may also need a 2nd dose. For females of childbearing age, rubella immunity should be determined. If there is no evidence of immunity, females who are not pregnant should be vaccinated. If there is no evidence of immunity, females who are pregnant should delay immunization until after pregnancy.  Pneumococcal 13-valent conjugate (PCV13) vaccine.** / Consult your health care provider.  Pneumococcal polysaccharide (PPSV23) vaccine.** / 1 to 2 doses if you smoke cigarettes or if you have certain conditions.  Meningococcal vaccine.** /  1 dose if you are age 19 to 21 years and a first-year college  student living in a residence hall, or have one of several medical conditions, you need to get vaccinated against meningococcal disease. You may also need additional booster doses.  Hepatitis A vaccine.** / Consult your health care provider.  Hepatitis B vaccine.** / Consult your health care provider.  Haemophilus influenzae type b (Hib) vaccine.** / Consult your health care provider. Ages 40 to 64 years  Blood pressure check.** / Every 1 to 2 years.  Lipid and cholesterol check.** / Every 5 years beginning at age 20 years.  Lung cancer screening. / Every year if you are aged 55-80 years and have a 30-pack-year history of smoking and currently smoke or have quit within the past 15 years. Yearly screening is stopped once you have quit smoking for at least 15 years or develop a health problem that would prevent you from having lung cancer treatment.  Clinical breast exam.** / Every year after age 40 years.  BRCA-related cancer risk assessment.** / For women who have family members with a BRCA-related cancer (breast, ovarian, tubal, or peritoneal cancers).  Mammogram.** / Every year beginning at age 40 years and continuing for as long as you are in good health. Consult with your health care provider.  Pap test.** / Every 3 years starting at age 30 years through age 65 or 70 years with a history of 3 consecutive normal Pap tests.  HPV screening.** / Every 3 years from ages 30 years through ages 65 to 70 years with a history of 3 consecutive normal Pap tests.  Fecal occult blood test (FOBT) of stool. / Every year beginning at age 50 years and continuing until age 75 years. You may not need to do this test if you get a colonoscopy every 10 years.  Flexible sigmoidoscopy or colonoscopy.** / Every 5 years for a flexible sigmoidoscopy or every 10 years for a colonoscopy beginning at age 50 years and continuing until age 75 years.  Hepatitis C blood test.** / For all people born from 1945 through  1965 and any individual with known risks for hepatitis C.  Skin self-exam. / Monthly.  Influenza vaccine. / Every year.  Tetanus, diphtheria, and acellular pertussis (Tdap/Td) vaccine.** / Consult your health care provider. Pregnant women should receive 1 dose of Tdap vaccine during each pregnancy. 1 dose of Td every 10 years.  Varicella vaccine.** / Consult your health care provider. Pregnant females who do not have evidence of immunity should receive the first dose after pregnancy.  Zoster vaccine.** / 1 dose for adults aged 60 years or older.  Measles, mumps, rubella (MMR) vaccine.** / You need at least 1 dose of MMR if you were born in 1957 or later. You may also need a 2nd dose. For females of childbearing age, rubella immunity should be determined. If there is no evidence of immunity, females who are not pregnant should be vaccinated. If there is no evidence of immunity, females who are pregnant should delay immunization until after pregnancy.  Pneumococcal 13-valent conjugate (PCV13) vaccine.** / Consult your health care provider.  Pneumococcal polysaccharide (PPSV23) vaccine.** / 1 to 2 doses if you smoke cigarettes or if you have certain conditions.  Meningococcal vaccine.** / Consult your health care provider.  Hepatitis A vaccine.** / Consult your health care provider.  Hepatitis B vaccine.** / Consult your health care provider.  Haemophilus influenzae type b (Hib) vaccine.** / Consult your health care provider. Ages 65   years and over  Blood pressure check.** / Every 1 to 2 years.  Lipid and cholesterol check.** / Every 5 years beginning at age 22 years.  Lung cancer screening. / Every year if you are aged 73-80 years and have a 30-pack-year history of smoking and currently smoke or have quit within the past 15 years. Yearly screening is stopped once you have quit smoking for at least 15 years or develop a health problem that would prevent you from having lung cancer  treatment.  Clinical breast exam.** / Every year after age 4 years.  BRCA-related cancer risk assessment.** / For women who have family members with a BRCA-related cancer (breast, ovarian, tubal, or peritoneal cancers).  Mammogram.** / Every year beginning at age 40 years and continuing for as long as you are in good health. Consult with your health care provider.  Pap test.** / Every 3 years starting at age 9 years through age 34 or 91 years with 3 consecutive normal Pap tests. Testing can be stopped between 65 and 70 years with 3 consecutive normal Pap tests and no abnormal Pap or HPV tests in the past 10 years.  HPV screening.** / Every 3 years from ages 57 years through ages 64 or 45 years with a history of 3 consecutive normal Pap tests. Testing can be stopped between 65 and 70 years with 3 consecutive normal Pap tests and no abnormal Pap or HPV tests in the past 10 years.  Fecal occult blood test (FOBT) of stool. / Every year beginning at age 15 years and continuing until age 17 years. You may not need to do this test if you get a colonoscopy every 10 years.  Flexible sigmoidoscopy or colonoscopy.** / Every 5 years for a flexible sigmoidoscopy or every 10 years for a colonoscopy beginning at age 86 years and continuing until age 71 years.  Hepatitis C blood test.** / For all people born from [birth year] through 1964 and any individual with known risks for hepatitis C.  Osteoporosis screening.** / A one-time screening for women ages 83 years and over and women at risk for fractures or osteoporosis.  Skin self-exam. / Monthly.  Influenza vaccine. / Every year.  Tetanus, diphtheria, and acellular pertussis (Tdap/Td) vaccine.** / 1 dose of Td every 10 years.  Varicella vaccine.** / Consult your health care provider.  Zoster vaccine.** / 1 dose for adults aged 61 years or older.  Pneumococcal 13-valent conjugate (PCV13) vaccine.** / Consult your health care provider.  Pneumococcal  polysaccharide (PPSV23) vaccine.** / 1 dose for all adults aged 28 years and older.  Meningococcal vaccine.** / Consult your health care provider.  Hepatitis A vaccine.** / Consult your health care provider.  Hepatitis B vaccine.** / Consult your health care provider.  Haemophilus influenzae type b (Hib) vaccine.** / Consult your health care provider. ** Family history and personal history of risk and conditions may change your health care provider's recommendations. Document Released: 04/18/2001 Document Revised: 07/07/2013 Document Reviewed: 07/18/2010 [facility] Patient Information 2015 [place], [place]. This information is not intended to replace advice given to you by your health care provider. Make sure you discuss any questions you have with your health care provider.

## 2014-08-24 NOTE — Progress Notes (Signed)
Pre visit review using our clinic review tool, if applicable. No additional management support is needed unless otherwise documented below in the visit note. 

## 2014-08-24 NOTE — Progress Notes (Signed)
Subjective:     Amy Zimmerman is a 64 y.o. female and is here for a comprehensive physical exam. The patient reports no problems.  History   Social History  . Marital Status: Married    Spouse Name: N/A  . Number of Children: N/A  . Years of Education: N/A   Occupational History  . [employer]    Social History Main Topics  . Smoking status: Never Smoker   . Smokeless tobacco: Never Used  . Alcohol Use: No  . Drug Use: No  . Sexual Activity:    Partners: Male   Other Topics Concern  . Not on file   Social History Narrative   Exercising--  Not regular   Health Maintenance  Topic Date Due  . ZOSTAVAX  02/28/2012  . PAP SMEAR  03/08/2014  . HIV Screening  08/24/2015 (Originally 02/28/1966)  . INFLUENZA VACCINE  10/05/2014  . MAMMOGRAM  08/13/2015  . COLONOSCOPY  04/16/2016  . TETANUS/TDAP  03/08/2021    The following portions of the patient's history were reviewed and updated as appropriate:  She  has a past medical history of Hypertension. She  does not have any pertinent problems on file. She  has past surgical history that includes uterine polyps and Colonoscopy. Her family history includes Heart disease in her mother; Heart disease (age of onset: 31) in her father; Hyperlipidemia in her sister and sister; Hypertension in her mother; Kidney disease in her mother. There is no history of Colon cancer or Stomach cancer. She  reports that she has never smoked. She has never used smokeless tobacco. She reports that she does not drink alcohol or use illicit drugs. She has a current medication list which includes the following prescription(s): calcium carbonate, hydrochlorothiazide, and metoprolol succinate. Current Outpatient Prescriptions on File Prior to Visit  Medication Sig Dispense Refill  . calcium carbonate (OS-CAL) 600 MG TABS Take 600 mg by mouth 2 (two) times daily with a meal.      . hydrochlorothiazide (HYDRODIURIL) 50 MG tablet TAKE ONE  TABLET BY MOUTH EVERY DAY 90 tablet 1  . metoprolol succinate (TOPROL-XL) 100 MG 24 hr tablet Take 1 tablet (100 mg total) by mouth daily. Take with or immediately following a meal. 90 tablet 1   No current facility-administered medications on file prior to visit.   She has No Known Allergies..  Review of Systems Review of Systems  Constitutional: Negative for activity change, appetite change and fatigue.  HENT: Negative for hearing loss, congestion, tinnitus and ear discharge.  dentist q27m Eyes: Negative for visual disturbance (see optho q1y -- vision corrected to 20/20 with glasses).  Respiratory: Negative for cough, chest tightness and shortness of breath.   Cardiovascular: Negative for chest pain, palpitations and leg swelling.  Gastrointestinal: Negative for abdominal pain, diarrhea, constipation and abdominal distention.  Genitourinary: Negative for urgency, frequency, decreased urine volume and difficulty urinating.  Musculoskeletal: Negative for back pain, arthralgias and gait problem.  Skin: Negative for color change, pallor and rash.  Neurological: Negative for dizziness, light-headedness, numbness and headaches.  Hematological: Negative for adenopathy. Does not bruise/bleed easily.  Psychiatric/Behavioral: Negative for suicidal ideas, confusion, sleep disturbance, self-injury, dysphoric mood, decreased concentration and agitation.       Objective:  BP 134/78 mmHg  Pulse 74  Temp(Src) 99 F (37.2 C) (Oral)  Ht 5\' 3"  (1.6 m)  Wt 141 lb 6.4 oz (64.139 kg)  BMI 25.05 kg/m2  SpO2 96% General appearance: alert, cooperative, appears stated  age and no distress Head: Normocephalic, without obvious abnormality, atraumatic Eyes: conjunctivae/corneas clear. PERRL, EOM's intact. Fundi benign. Ears: normal TM's and external ear canals both ears Nose: Nares normal. Septum midline. Mucosa normal. No drainage or sinus tenderness. Throat: lips, mucosa, and tongue normal; teeth and  gums normal Neck: no adenopathy, no carotid bruit, no JVD, supple, symmetrical, trachea midline and thyroid not enlarged, symmetric, no tenderness/mass/nodules Back: symmetric, no curvature. ROM normal. No CVA tenderness. Lungs: clear to auscultation bilaterally Breasts: normal appearance, no masses or tenderness Heart: regular rate and rhythm, S1, S2 normal, no murmur, click, rub or gallop Abdomen: soft, non-tender; bowel sounds normal; no masses,  no organomegaly Pelvic: cervix normal in appearance, external genitalia normal, no adnexal masses or tenderness, no cervical motion tenderness, rectovaginal septum normal, uterus normal size, shape, and consistency, vagina normal without discharge and pap done, heme neg brown stool Extremities: extremities normal, atraumatic, no cyanosis or edema Pulses: 2+ and symmetric Skin: Skin color, texture, turgor normal. No rashes or lesions Lymph nodes: Cervical, supraclavicular, and axillary nodes normal. Neurologic: Alert and oriented X 3, normal strength and tone. Normal symmetric reflexes. Normal coordination and gait Psych- no depression, no anxiety      Assessment:    Healthy female exam.      Plan:    ghm utd Check labs See After Visit Summary for Counseling Recommendations    1. Preventative health care   - Basic metabolic panel - CBC with Differential/Platelet - Hepatic function panel - Lipid panel - POCT urinalysis dipstick - TSH - Cytology - PAP  2. Breast screening   - MM DIGITAL SCREENING BILATERAL; Future  3. Cervical cancer screening   - Cytology - PAP

## 2014-08-25 LAB — BASIC METABOLIC PANEL
BUN: 16 mg/dL (ref 6–23)
CHLORIDE: 97 meq/L (ref 96–112)
CO2: 30 meq/L (ref 19–32)
Calcium: 9.7 mg/dL (ref 8.4–10.5)
Creatinine, Ser: 0.82 mg/dL (ref 0.40–1.20)
GFR: 74.96 mL/min (ref >60.00)
Glucose, Bld: 92 mg/dL (ref 70–99)
Potassium: 4.2 mEq/L (ref 3.5–5.1)
Sodium: 133 mEq/L — ABNORMAL LOW (ref 135–145)

## 2014-08-25 LAB — POCT URINALYSIS DIPSTICK
BILIRUBIN UA: NEGATIVE
Blood, UA: NEGATIVE
Glucose, UA: NEGATIVE
Ketones, UA: NEGATIVE
LEUKOCYTES UA: NEGATIVE
NITRITE UA: NEGATIVE
Protein, UA: NEGATIVE
Spec Grav, UA: 1.015
UROBILINOGEN UA: 0.2
pH, UA: 6

## 2014-08-25 LAB — LIPID PANEL
CHOL/HDL RATIO: 4
Cholesterol: 209 mg/dL — ABNORMAL HIGH (ref 0–200)
HDL: 53.1 mg/dL (ref >39.00)
LDL Cholesterol: 142 mg/dL — ABNORMAL HIGH (ref 0–99)
NonHDL: 155.9
Triglycerides: 71 mg/dL (ref 0.0–149.0)
VLDL: 14.2 mg/dL (ref 0.0–40.0)

## 2014-08-25 LAB — HEPATIC FUNCTION PANEL
ALK PHOS: 72 U/L (ref 39–117)
ALT: 20 U/L (ref 0–35)
AST: 20 U/L (ref 0–37)
Albumin: 4.2 g/dL (ref 3.5–5.2)
Bilirubin, Direct: 0.1 mg/dL (ref 0.0–0.3)
TOTAL PROTEIN: 7.2 g/dL (ref 6.0–8.3)
Total Bilirubin: 0.3 mg/dL (ref 0.2–1.2)

## 2014-08-25 LAB — CBC WITH DIFFERENTIAL/PLATELET
BASOS PCT: 1.7 % (ref 0.0–3.0)
Basophils Absolute: 0.1 10*3/uL (ref 0.0–0.1)
Eosinophils Absolute: 0.1 10*3/uL (ref 0.0–0.7)
Eosinophils Relative: 1.8 % (ref 0.0–5.0)
HCT: 42.5 % (ref 36.0–46.0)
Hemoglobin: 14.1 g/dL (ref 12.0–15.0)
LYMPHS PCT: 37.8 % (ref 12.0–46.0)
Lymphs Abs: 3.1 10*3/uL (ref 0.7–4.0)
MCHC: 33.2 g/dL (ref 30.0–36.0)
MCV: 86.2 fl (ref 78.0–100.0)
MONOS PCT: 6.3 % (ref 3.0–12.0)
Monocytes Absolute: 0.5 10*3/uL (ref 0.1–1.0)
Neutro Abs: 4.3 10*3/uL (ref 1.4–7.7)
Neutrophils Relative %: 52.4 % (ref 43.0–77.0)
Platelets: 264 10*3/uL (ref 150.0–400.0)
RBC: 4.93 Mil/uL (ref 3.87–5.11)
RDW: 13.6 % (ref 11.5–15.5)
WBC: 8.2 10*3/uL (ref 4.0–10.5)

## 2014-08-25 LAB — TSH: TSH: 2.72 u[IU]/mL (ref 0.35–4.50)

## 2014-08-26 LAB — CYTOLOGY - PAP

## 2014-09-24 ENCOUNTER — Encounter: Payer: Self-pay | Admitting: Family Medicine

## 2014-09-24 ENCOUNTER — Ambulatory Visit (INDEPENDENT_AMBULATORY_CARE_PROVIDER_SITE_OTHER): Payer: BLUE CROSS/BLUE SHIELD | Admitting: Family Medicine

## 2014-09-24 VITALS — BP 146/90 | HR 81 | Temp 97.9°F | Resp 18 | Ht 63.0 in | Wt 139.0 lb

## 2014-09-24 DIAGNOSIS — J209 Acute bronchitis, unspecified: Secondary | ICD-10-CM

## 2014-09-24 MED ORDER — BENZONATATE 100 MG PO CAPS: 100.0000 mg | ORAL_CAPSULE | Freq: Two times a day (BID) | ORAL | Status: DC | PRN

## 2014-09-24 MED ORDER — AZITHROMYCIN 250 MG PO TABS: ORAL_TABLET | ORAL | Status: DC

## 2014-09-24 NOTE — Progress Notes (Signed)
++++++++++++++Patient ID: Amy Zimmerman, female    DOB: 08/19/1950  Age: 64 y.o. MRN: [id number]    Subjective:  Subjective HPI Amy Zimmerman presents for dry cough x 1 week.  She took mucinex and honey, lemon with no relief. No nasal congestion, no fever. On occasion she feels like she can cough something up but cant/  Review of Systems  Constitutional: Negative for fever and chills.  HENT: Negative for congestion, postnasal drip, rhinorrhea and sinus pressure.   Respiratory: Positive for cough and wheezing. Negative for chest tightness and shortness of breath.   Cardiovascular: Negative for chest pain, palpitations and leg swelling.  Allergic/Immunologic: Negative for environmental allergies.  Psychiatric/Behavioral: Negative for confusion and decreased concentration. The patient is not nervous/anxious.     History Past Medical History  Diagnosis Date  . Hypertension     She has past surgical history that includes uterine polyps and Colonoscopy.   Her family history includes Heart disease in her mother; Heart disease (age of onset: 47) in her father; Hyperlipidemia in her sister and sister; Hypertension in her mother; Kidney disease in her mother. There is no history of Colon cancer or Stomach cancer.She reports that she has never smoked. She has never used smokeless tobacco. She reports that she does not drink alcohol or use illicit drugs.  Current Outpatient Prescriptions on File Prior to Visit  Medication Sig Dispense Refill  . calcium carbonate (OS-CAL) 600 MG TABS Take 600 mg by mouth 2 (two) times daily with a meal.      . hydrochlorothiazide (HYDRODIURIL) 50 MG tablet TAKE ONE TABLET BY MOUTH EVERY DAY 90 tablet 1  . metoprolol succinate (TOPROL-XL) 100 MG 24 hr tablet Take 1 tablet (100 mg total) by mouth daily. Take with or immediately following a meal. 90 tablet 1   No current facility-administered medications on file prior to visit.     Objective:   Objective Physical Exam  Constitutional: She is oriented to person, place, and time. She appears well-developed and well-nourished.  HENT:  Right Ear: External ear normal.  Left Ear: External ear normal.  Eyes: Conjunctivae are normal. Right eye exhibits no discharge. Left eye exhibits no discharge.  Cardiovascular: Normal rate, regular rhythm and normal heart sounds.   No murmur heard. Pulmonary/Chest: Effort normal. No respiratory distress. She has wheezes. She has no rales. She exhibits tenderness.  Musculoskeletal: She exhibits no edema.  Lymphadenopathy:    She has cervical adenopathy.  Neurological: She is alert and oriented to person, place, and time.  Psychiatric: She has a normal mood and affect. Her behavior is normal.   BP 146/90 mmHg  Pulse 81  Temp(Src) 97.9 F (36.6 C) (Oral)  Resp 18  Ht 5\' 3"  (1.6 m)  Wt 139 lb (63.05 kg)  BMI 24.63 kg/m2  SpO2 99% Wt Readings from Last 3 Encounters:  09/24/14 139 lb (63.05 kg)  08/24/14 141 lb 6.4 oz (64.139 kg)  07/18/13 163 lb 12.8 oz (74.299 kg)     Lab Results  Component Value Date   WBC 8.2 08/24/2014   HGB 14.1 08/24/2014   HCT 42.5 08/24/2014   PLT 264.0 08/24/2014   GLUCOSE 92 08/24/2014   CHOL 209* 08/24/2014   TRIG 71.0 08/24/2014   HDL 53.10 08/24/2014   LDLDIRECT 159.9 11/21/2012   LDLCALC 142* 08/24/2014   ALT 20 08/24/2014   AST 20 08/24/2014   NA 133* 08/24/2014   K 4.2 08/24/2014   CL 97 08/24/2014   CREATININE  0.82 08/24/2014   BUN 16 08/24/2014   CO2 30 08/24/2014   TSH 2.72 08/24/2014   HGBA1C 5.5 02/18/2007    No results found.   Assessment & Plan:  Plan I am having Amy Zimmerman start on azithromycin and benzonatate. I am also having her maintain her calcium carbonate, metoprolol succinate, and hydrochlorothiazide.  Meds ordered this encounter  Medications  . azithromycin (ZITHROMAX Z-PAK) 250 MG tablet    Sig: As directed    Dispense:  6 each    Refill:  0  . benzonatate  (TESSALON) 100 MG capsule    Sig: Take 1 capsule (100 mg total) by mouth 2 (two) times daily as needed for cough.    Dispense:  20 capsule    Refill:  0    Problem List Items Addressed This Visit    None    Visit Diagnoses    Acute bronchitis, unspecified organism    -  Primary    Relevant Medications    azithromycin (ZITHROMAX Z-PAK) 250 MG tablet    benzonatate (TESSALON) 100 MG capsule       Follow-up: Return if symptoms worsen or fail to improve.  Garnet Koyanagi, DO

## 2014-09-24 NOTE — Progress Notes (Signed)
Pre visit review using our clinic review tool, if applicable. No additional management support is needed unless otherwise documented below in the visit note. 

## 2014-09-24 NOTE — Patient Instructions (Signed)

## 2014-10-15 ENCOUNTER — Ambulatory Visit
Admission: RE | Admit: 2014-10-15 | Discharge: 2014-10-15 | Disposition: A | Discharge status: Home / Self Care | Payer: BLUE CROSS/BLUE SHIELD | Source: Ambulatory Visit | Attending: Family Medicine | Admitting: Family Medicine

## 2014-10-15 DIAGNOSIS — Z1239 Encounter for other screening for malignant neoplasm of breast: Secondary | ICD-10-CM

## 2014-12-26 ENCOUNTER — Other Ambulatory Visit: Payer: Self-pay | Admitting: Family Medicine

## 2015-07-02 ENCOUNTER — Other Ambulatory Visit: Payer: Self-pay | Admitting: Family Medicine

## 2015-08-26 ENCOUNTER — Ambulatory Visit (INDEPENDENT_AMBULATORY_CARE_PROVIDER_SITE_OTHER): Payer: BLUE CROSS/BLUE SHIELD | Admitting: Family Medicine

## 2015-08-26 ENCOUNTER — Encounter: Payer: Self-pay | Admitting: Family Medicine

## 2015-08-26 VITALS — BP 154/84 | HR 67 | Temp 98.4°F | Ht 62.25 in | Wt 145.8 lb

## 2015-08-26 DIAGNOSIS — Z1239 Encounter for other screening for malignant neoplasm of breast: Secondary | ICD-10-CM

## 2015-08-26 DIAGNOSIS — I10 Essential (primary) hypertension: Secondary | ICD-10-CM | POA: Diagnosis not present

## 2015-08-26 DIAGNOSIS — Z0000 Encounter for general adult medical examination without abnormal findings: Secondary | ICD-10-CM | POA: Diagnosis not present

## 2015-08-26 LAB — POCT URINALYSIS DIPSTICK
BILIRUBIN UA: NEGATIVE
Blood, UA: NEGATIVE
GLUCOSE UA: NEGATIVE
KETONES UA: NEGATIVE
LEUKOCYTES UA: NEGATIVE
NITRITE UA: NEGATIVE
Protein, UA: NEGATIVE
Spec Grav, UA: <=1.005
Urobilinogen, UA: 0.2
pH, UA: 6

## 2015-08-26 NOTE — Patient Instructions (Signed)
Preventive Care for Adults, Female A healthy lifestyle and preventive care can promote health and wellness. Preventive health guidelines for women include the following key practices.  A routine yearly physical is a good way to check with your health care provider about your health and preventive screening. It is a chance to share any concerns and updates on your health and to receive a thorough exam.  Visit your dentist for a routine exam and preventive care every 6 months. Brush your teeth twice a day and floss once a day. Good oral hygiene prevents tooth decay and gum disease.  The frequency of eye exams is based on your age, health, family medical history, use of contact lenses, and other factors. Follow your health care provider's recommendations for frequency of eye exams.  Eat a healthy diet. Foods like vegetables, fruits, whole grains, low-fat dairy products, and lean protein foods contain the nutrients you need without too many calories. Decrease your intake of foods high in solid fats, added sugars, and salt. Eat the right amount of calories for you.Get information about a proper diet from your health care provider, if necessary.  Regular physical exercise is one of the most important things you can do for your health. Most adults should get at least 150 minutes of moderate-intensity exercise (any activity that increases your heart rate and causes you to sweat) each week. In addition, most adults need muscle-strengthening exercises on 2 or more days a week.  Maintain a healthy weight. The body mass index (BMI) is a screening tool to identify possible weight problems. It provides an estimate of body fat based on height and weight. Your health care provider can find your BMI and can help you achieve or maintain a healthy weight.For adults 20 years and older:  A BMI below 18.5 is considered underweight.  A BMI of 18.5 to 24.9 is normal.  A BMI of 25 to 29.9 is considered overweight.  A  BMI of 30 and above is considered obese.  Maintain normal blood lipids and cholesterol levels by exercising and minimizing your intake of saturated fat. Eat a balanced diet with plenty of fruit and vegetables. Blood tests for lipids and cholesterol should begin at age 45 and be repeated every 5 years. If your lipid or cholesterol levels are high, you are over 50, or you are at high risk for heart disease, you may need your cholesterol levels checked more frequently.Ongoing high lipid and cholesterol levels should be treated with medicines if diet and exercise are not working.  If you smoke, find out from your health care provider how to quit. If you do not use tobacco, do not start.  Lung cancer screening is recommended for adults aged 45-80 years who are at high risk for developing lung cancer because of a history of smoking. A yearly low-dose CT scan of the lungs is recommended for people who have at least a 30-pack-year history of smoking and are a current smoker or have quit within the past 15 years. A pack year of smoking is smoking an average of 1 pack of cigarettes a day for 1 year (for example: 1 pack a day for 30 years or 2 packs a day for 15 years). Yearly screening should continue until the smoker has stopped smoking for at least 15 years. Yearly screening should be stopped for people who develop a health problem that would prevent them from having lung cancer treatment.  If you are pregnant, do not drink alcohol. If you are  breastfeeding, be very cautious about drinking alcohol. If you are not pregnant and choose to drink alcohol, do not have more than 1 drink per day. One drink is considered to be 12 ounces (355 mL) of beer, 5 ounces (148 mL) of wine, or 1.5 ounces (44 mL) of liquor.  Avoid use of street drugs. Do not share needles with anyone. Ask for help if you need support or instructions about stopping the use of drugs.  High blood pressure causes heart disease and increases the risk  of stroke. Your blood pressure should be checked at least every 1 to 2 years. Ongoing high blood pressure should be treated with medicines if weight loss and exercise do not work.  If you are 55-79 years old, ask your health care provider if you should take aspirin to prevent strokes.  Diabetes screening is done by taking a blood sample to check your blood glucose level after you have not eaten for a certain period of time (fasting). If you are not overweight and you do not have risk factors for diabetes, you should be screened once every 3 years starting at age 45. If you are overweight or obese and you are 40-70 years of age, you should be screened for diabetes every year as part of your cardiovascular risk assessment.  Breast cancer screening is essential preventive care for women. You should practice "breast self-awareness." This means understanding the normal appearance and feel of your breasts and may include breast self-examination. Any changes detected, no matter how small, should be reported to a health care provider. Women in their 20s and 30s should have a clinical breast exam (CBE) by a health care provider as part of a regular health exam every 1 to 3 years. After age 40, women should have a CBE every year. Starting at age 40, women should consider having a mammogram (breast X-ray test) every year. Women who have a family history of breast cancer should talk to their health care provider about genetic screening. Women at a high risk of breast cancer should talk to their health care providers about having an MRI and a mammogram every year.  Breast cancer gene (BRCA)-related cancer risk assessment is recommended for women who have family members with BRCA-related cancers. BRCA-related cancers include breast, ovarian, tubal, and peritoneal cancers. Having family members with these cancers may be associated with an increased risk for harmful changes (mutations) in the breast cancer genes BRCA1 and  BRCA2. Results of the assessment will determine the need for genetic counseling and BRCA1 and BRCA2 testing.  Your health care provider may recommend that you be screened regularly for cancer of the pelvic organs (ovaries, uterus, and vagina). This screening involves a pelvic examination, including checking for microscopic changes to the surface of your cervix (Pap test). You may be encouraged to have this screening done every 3 years, beginning at age 21.  For women ages 30-65, health care providers may recommend pelvic exams and Pap testing every 3 years, or they may recommend the Pap and pelvic exam, combined with testing for human papilloma virus (HPV), every 5 years. Some types of HPV increase your risk of cervical cancer. Testing for HPV may also be done on women of any age with unclear Pap test results.  Other health care providers may not recommend any screening for nonpregnant women who are considered low risk for pelvic cancer and who do not have symptoms. Ask your health care provider if a screening pelvic exam is right for   you.  If you have had past treatment for cervical cancer or a condition that could lead to cancer, you need Pap tests and screening for cancer for at least 20 years after your treatment. If Pap tests have been discontinued, your risk factors (such as having a new sexual partner) need to be reassessed to determine if screening should resume. Some women have medical problems that increase the chance of getting cervical cancer. In these cases, your health care provider may recommend more frequent screening and Pap tests.  Colorectal cancer can be detected and often prevented. Most routine colorectal cancer screening begins at the age of 50 years and continues through age 75 years. However, your health care provider may recommend screening at an earlier age if you have risk factors for colon cancer. On a yearly basis, your health care provider may provide home test kits to check  for hidden blood in the stool. Use of a small camera at the end of a tube, to directly examine the colon (sigmoidoscopy or colonoscopy), can detect the earliest forms of colorectal cancer. Talk to your health care provider about this at age 50, when routine screening begins. Direct exam of the colon should be repeated every 5-10 years through age 75 years, unless early forms of precancerous polyps or small growths are found.  People who are at an increased risk for hepatitis B should be screened for this virus. You are considered at high risk for hepatitis B if:  You were born in a country where hepatitis B occurs often. Talk with your health care provider about which countries are considered high risk.  Your parents were born in a high-risk country and you have not received a shot to protect against hepatitis B (hepatitis B vaccine).  You have HIV or AIDS.  You use needles to inject street drugs.  You live with, or have sex with, someone who has hepatitis B.  You get hemodialysis treatment.  You take certain medicines for conditions like cancer, organ transplantation, and autoimmune conditions.  Hepatitis C blood testing is recommended for all people born from 1945 through 1965 and any individual with known risks for hepatitis C.  Practice safe sex. Use condoms and avoid high-risk sexual practices to reduce the spread of sexually transmitted infections (STIs). STIs include gonorrhea, chlamydia, syphilis, trichomonas, herpes, HPV, and human immunodeficiency virus (HIV). Herpes, HIV, and HPV are viral illnesses that have no cure. They can result in disability, cancer, and death.  You should be screened for sexually transmitted illnesses (STIs) including gonorrhea and chlamydia if:  You are sexually active and are younger than 24 years.  You are older than 24 years and your health care provider tells you that you are at risk for this type of infection.  Your sexual activity has changed  since you were last screened and you are at an increased risk for chlamydia or gonorrhea. Ask your health care provider if you are at risk.  If you are at risk of being infected with HIV, it is recommended that you take a prescription medicine daily to prevent HIV infection. This is called preexposure prophylaxis (PrEP). You are considered at risk if:  You are sexually active and do not regularly use condoms or know the HIV status of your partner(s).  You take drugs by injection.  You are sexually active with a partner who has HIV.  Talk with your health care provider about whether you are at high risk of being infected with HIV. If   you choose to begin PrEP, you should first be tested for HIV. You should then be tested every 3 months for as long as you are taking PrEP.  Osteoporosis is a disease in which the bones lose minerals and strength with aging. This can result in serious bone fractures or breaks. The risk of osteoporosis can be identified using a bone density scan. Women ages 67 years and over and women at risk for fractures or osteoporosis should discuss screening with their health care providers. Ask your health care provider whether you should take a calcium supplement or vitamin D to reduce the rate of osteoporosis.  Menopause can be associated with physical symptoms and risks. Hormone replacement therapy is available to decrease symptoms and risks. You should talk to your health care provider about whether hormone replacement therapy is right for you.  Use sunscreen. Apply sunscreen liberally and repeatedly throughout the day. You should seek shade when your shadow is shorter than you. Protect yourself by wearing long sleeves, pants, a wide-brimmed hat, and sunglasses year round, whenever you are outdoors.  Once a month, do a whole body skin exam, using a mirror to look at the skin on your back. Tell your health care provider of new moles, moles that have irregular borders, moles that  are larger than a pencil eraser, or moles that have changed in shape or color.  Stay current with required vaccines (immunizations).  Influenza vaccine. All adults should be immunized every year.  Tetanus, diphtheria, and acellular pertussis (Td, Tdap) vaccine. Pregnant women should receive 1 dose of Tdap vaccine during each pregnancy. The dose should be obtained regardless of the length of time since the last dose. Immunization is preferred during the 27th-36th week of gestation. An adult who has not previously received Tdap or who does not know her vaccine status should receive 1 dose of Tdap. This initial dose should be followed by tetanus and diphtheria toxoids (Td) booster doses every 10 years. Adults with an unknown or incomplete history of completing a 3-dose immunization series with Td-containing vaccines should begin or complete a primary immunization series including a Tdap dose. Adults should receive a Td booster every 10 years.  Varicella vaccine. An adult without evidence of immunity to varicella should receive 2 doses or a second dose if she has previously received 1 dose. Pregnant females who do not have evidence of immunity should receive the first dose after pregnancy. This first dose should be obtained before leaving the health care facility. The second dose should be obtained 4-8 weeks after the first dose.  Human papillomavirus (HPV) vaccine. Females aged 13-26 years who have not received the vaccine previously should obtain the 3-dose series. The vaccine is not recommended for use in pregnant females. However, pregnancy testing is not needed before receiving a dose. If a female is found to be pregnant after receiving a dose, no treatment is needed. In that case, the remaining doses should be delayed until after the pregnancy. Immunization is recommended for any person with an immunocompromised condition through the age of 61 years if she did not get any or all doses earlier. During the  3-dose series, the second dose should be obtained 4-8 weeks after the first dose. The third dose should be obtained 24 weeks after the first dose and 16 weeks after the second dose.  Zoster vaccine. One dose is recommended for adults aged 30 years or older unless certain conditions are present.  Measles, mumps, and rubella (MMR) vaccine. Adults born  before 1957 generally are considered immune to measles and mumps. Adults born in 1957 or later should have 1 or more doses of MMR vaccine unless there is a contraindication to the vaccine or there is laboratory evidence of immunity to each of the three diseases. A routine second dose of MMR vaccine should be obtained at least 28 days after the first dose for students attending postsecondary schools, health care workers, or international travelers. People who received inactivated measles vaccine or an unknown type of measles vaccine during 1963-1967 should receive 2 doses of MMR vaccine. People who received inactivated mumps vaccine or an unknown type of mumps vaccine before 1979 and are at high risk for mumps infection should consider immunization with 2 doses of MMR vaccine. For females of childbearing age, rubella immunity should be determined. If there is no evidence of immunity, females who are not pregnant should be vaccinated. If there is no evidence of immunity, females who are pregnant should delay immunization until after pregnancy. Unvaccinated health care workers born before 1957 who lack laboratory evidence of measles, mumps, or rubella immunity or laboratory confirmation of disease should consider measles and mumps immunization with 2 doses of MMR vaccine or rubella immunization with 1 dose of MMR vaccine.  Pneumococcal 13-valent conjugate (PCV13) vaccine. When indicated, a person who is uncertain of his immunization history and has no record of immunization should receive the PCV13 vaccine. All adults 65 years of age and older should receive this  vaccine. An adult aged 19 years or older who has certain medical conditions and has not been previously immunized should receive 1 dose of PCV13 vaccine. This PCV13 should be followed with a dose of pneumococcal polysaccharide (PPSV23) vaccine. Adults who are at high risk for pneumococcal disease should obtain the PPSV23 vaccine at least 8 weeks after the dose of PCV13 vaccine. Adults older than 65 years of age who have normal immune system function should obtain the PPSV23 vaccine dose at least 1 year after the dose of PCV13 vaccine.  Pneumococcal polysaccharide (PPSV23) vaccine. When PCV13 is also indicated, PCV13 should be obtained first. All adults aged 65 years and older should be immunized. An adult younger than age 65 years who has certain medical conditions should be immunized. Any person who resides in a nursing home or long-term care facility should be immunized. An adult smoker should be immunized. People with an immunocompromised condition and certain other conditions should receive both PCV13 and PPSV23 vaccines. People with human immunodeficiency virus (HIV) infection should be immunized as soon as possible after diagnosis. Immunization during chemotherapy or radiation therapy should be avoided. Routine use of PPSV23 vaccine is not recommended for American Indians, [place] Natives, or people younger than 65 years unless there are medical conditions that require PPSV23 vaccine. When indicated, people who have unknown immunization and have no record of immunization should receive PPSV23 vaccine. One-time revaccination 5 years after the first dose of PPSV23 is recommended for people aged 19-64 years who have chronic kidney failure, nephrotic syndrome, asplenia, or immunocompromised conditions. People who received 1-2 doses of PPSV23 before age 65 years should receive another dose of PPSV23 vaccine at age 65 years or later if at least 5 years have passed since the previous dose. Doses of PPSV23 are not  needed for people immunized with PPSV23 at or after age 65 years.  Meningococcal vaccine. Adults with asplenia or persistent complement component deficiencies should receive 2 doses of quadrivalent meningococcal conjugate (MenACWY-D) vaccine. The doses should be obtained   at least 2 months apart. Microbiologists working with certain meningococcal bacteria, [place] recruits, people at risk during an outbreak, and people who travel to or live in countries with a high rate of meningitis should be immunized. A first-year college student up through age 34 years who is living in a residence hall should receive a dose if she did not receive a dose on or after her 16th birthday. Adults who have certain high-risk conditions should receive one or more doses of vaccine.  Hepatitis A vaccine. Adults who wish to be protected from this disease, have certain high-risk conditions, work with hepatitis A-infected animals, work in hepatitis A research labs, or travel to or work in countries with a high rate of hepatitis A should be immunized. Adults who were previously unvaccinated and who anticipate close contact with an international adoptee during the first 60 days after arrival in the [place] from a country with a high rate of hepatitis A should be immunized.  Hepatitis B vaccine. Adults who wish to be protected from this disease, have certain high-risk conditions, may be exposed to blood or other infectious body fluids, are household contacts or sex partners of hepatitis B positive people, are clients or workers in certain care facilities, or travel to or work in countries with a high rate of hepatitis B should be immunized.  Haemophilus influenzae type b (Hib) vaccine. A previously unvaccinated person with asplenia or sickle cell disease or having a scheduled splenectomy should receive 1 dose of Hib vaccine. Regardless of previous immunization, a recipient of a hematopoietic stem cell transplant should receive a  3-dose series 6-12 months after her successful transplant. Hib vaccine is not recommended for adults with HIV infection. Preventive Services / Frequency Ages 35 to 4 years  Blood pressure check.** / Every 3-5 years.  Lipid and cholesterol check.** / Every 5 years beginning at age 60.  Clinical breast exam.** / Every 3 years for women in their 71s and 10s.  BRCA-related cancer risk assessment.** / For women who have family members with a BRCA-related cancer (breast, ovarian, tubal, or peritoneal cancers).  Pap test.** / Every 2 years from ages 76 through 26. Every 3 years starting at age 61 through age 76 or 93 with a history of 3 consecutive normal Pap tests.  HPV screening.** / Every 3 years from ages 37 through ages 60 to 51 with a history of 3 consecutive normal Pap tests.  Hepatitis C blood test.** / For any individual with known risks for hepatitis C.  Skin self-exam. / Monthly.  Influenza vaccine. / Every year.  Tetanus, diphtheria, and acellular pertussis (Tdap, Td) vaccine.** / Consult your health care provider. Pregnant women should receive 1 dose of Tdap vaccine during each pregnancy. 1 dose of Td every 10 years.  Varicella vaccine.** / Consult your health care provider. Pregnant females who do not have evidence of immunity should receive the first dose after pregnancy.  HPV vaccine. / 3 doses over 6 months, if 93 and younger. The vaccine is not recommended for use in pregnant females. However, pregnancy testing is not needed before receiving a dose.  Measles, mumps, rubella (MMR) vaccine.** / You need at least 1 dose of MMR if you were born in 1957 or later. You may also need a 2nd dose. For females of childbearing age, rubella immunity should be determined. If there is no evidence of immunity, females who are not pregnant should be vaccinated. If there is no evidence of immunity, females who are  pregnant should delay immunization until after pregnancy.  Pneumococcal  13-valent conjugate (PCV13) vaccine.** / Consult your health care provider.  Pneumococcal polysaccharide (PPSV23) vaccine.** / 1 to 2 doses if you smoke cigarettes or if you have certain conditions.  Meningococcal vaccine.** / 1 dose if you are age 68 to 8 years and a Market researcher living in a residence hall, or have one of several medical conditions, you need to get vaccinated against meningococcal disease. You may also need additional booster doses.  Hepatitis A vaccine.** / Consult your health care provider.  Hepatitis B vaccine.** / Consult your health care provider.  Haemophilus influenzae type b (Hib) vaccine.** / Consult your health care provider. Ages 7 to 53 years  Blood pressure check.** / Every year.  Lipid and cholesterol check.** / Every 5 years beginning at age 25 years.  Lung cancer screening. / Every year if you are aged 11-80 years and have a 30-pack-year history of smoking and currently smoke or have quit within the past 15 years. Yearly screening is stopped once you have quit smoking for at least 15 years or develop a health problem that would prevent you from having lung cancer treatment.  Clinical breast exam.** / Every year after age 48 years.  BRCA-related cancer risk assessment.** / For women who have family members with a BRCA-related cancer (breast, ovarian, tubal, or peritoneal cancers).  Mammogram.** / Every year beginning at age 41 years and continuing for as long as you are in good health. Consult with your health care provider.  Pap test.** / Every 3 years starting at age 65 years through age 37 or 70 years with a history of 3 consecutive normal Pap tests.  HPV screening.** / Every 3 years from ages 72 years through ages 60 to 40 years with a history of 3 consecutive normal Pap tests.  Fecal occult blood test (FOBT) of stool. / Every year beginning at age 21 years and continuing until age 5 years. You may not need to do this test if you get  a colonoscopy every 10 years.  Flexible sigmoidoscopy or colonoscopy.** / Every 5 years for a flexible sigmoidoscopy or every 10 years for a colonoscopy beginning at age 35 years and continuing until age 48 years.  Hepatitis C blood test.** / For all people born from [birth year] through 1965 and any individual with known risks for hepatitis C.  Skin self-exam. / Monthly.  Influenza vaccine. / Every year.  Tetanus, diphtheria, and acellular pertussis (Tdap/Td) vaccine.** / Consult your health care provider. Pregnant women should receive 1 dose of Tdap vaccine during each pregnancy. 1 dose of Td every 10 years.  Varicella vaccine.** / Consult your health care provider. Pregnant females who do not have evidence of immunity should receive the first dose after pregnancy.  Zoster vaccine.** / 1 dose for adults aged 30 years or older.  Measles, mumps, rubella (MMR) vaccine.** / You need at least 1 dose of MMR if you were born in 1957 or later. You may also need a second dose. For females of childbearing age, rubella immunity should be determined. If there is no evidence of immunity, females who are not pregnant should be vaccinated. If there is no evidence of immunity, females who are pregnant should delay immunization until after pregnancy.  Pneumococcal 13-valent conjugate (PCV13) vaccine.** / Consult your health care provider.  Pneumococcal polysaccharide (PPSV23) vaccine.** / 1 to 2 doses if you smoke cigarettes or if you have certain conditions.  Meningococcal vaccine.** /  Consult your health care provider.  Hepatitis A vaccine.** / Consult your health care provider.  Hepatitis B vaccine.** / Consult your health care provider.  Haemophilus influenzae type b (Hib) vaccine.** / Consult your health care provider. Ages 64 years and over  Blood pressure check.** / Every year.  Lipid and cholesterol check.** / Every 5 years beginning at age 23 years.  Lung cancer screening. / Every year if you  are aged 16-80 years and have a 30-pack-year history of smoking and currently smoke or have quit within the past 15 years. Yearly screening is stopped once you have quit smoking for at least 15 years or develop a health problem that would prevent you from having lung cancer treatment.  Clinical breast exam.** / Every year after age 74 years.  BRCA-related cancer risk assessment.** / For women who have family members with a BRCA-related cancer (breast, ovarian, tubal, or peritoneal cancers).  Mammogram.** / Every year beginning at age 44 years and continuing for as long as you are in good health. Consult with your health care provider.  Pap test.** / Every 3 years starting at age 58 years through age 22 or 39 years with 3 consecutive normal Pap tests. Testing can be stopped between 65 and 70 years with 3 consecutive normal Pap tests and no abnormal Pap or HPV tests in the past 10 years.  HPV screening.** / Every 3 years from ages 64 years through ages 70 or 61 years with a history of 3 consecutive normal Pap tests. Testing can be stopped between 65 and 70 years with 3 consecutive normal Pap tests and no abnormal Pap or HPV tests in the past 10 years.  Fecal occult blood test (FOBT) of stool. / Every year beginning at age 40 years and continuing until age 27 years. You may not need to do this test if you get a colonoscopy every 10 years.  Flexible sigmoidoscopy or colonoscopy.** / Every 5 years for a flexible sigmoidoscopy or every 10 years for a colonoscopy beginning at age 7 years and continuing until age 32 years.  Hepatitis C blood test.** / For all people born from [birth year] through 1965 and any individual with known risks for hepatitis C.  Osteoporosis screening.** / A one-time screening for women ages 30 years and over and women at risk for fractures or osteoporosis.  Skin self-exam. / Monthly.  Influenza vaccine. / Every year.  Tetanus, diphtheria, and acellular pertussis (Tdap/Td)  vaccine.** / 1 dose of Td every 10 years.  Varicella vaccine.** / Consult your health care provider.  Zoster vaccine.** / 1 dose for adults aged 35 years or older.  Pneumococcal 13-valent conjugate (PCV13) vaccine.** / Consult your health care provider.  Pneumococcal polysaccharide (PPSV23) vaccine.** / 1 dose for all adults aged 46 years and older.  Meningococcal vaccine.** / Consult your health care provider.  Hepatitis A vaccine.** / Consult your health care provider.  Hepatitis B vaccine.** / Consult your health care provider.  Haemophilus influenzae type b (Hib) vaccine.** / Consult your health care provider. ** Family history and personal history of risk and conditions may change your health care provider's recommendations.   This information is not intended to replace advice given to you by your health care provider. Make sure you discuss any questions you have with your health care provider.   Document Released: 04/18/2001 Document Revised: 03/13/2014 Document Reviewed: 07/18/2010 Elsevier Interactive Patient Education [employer].

## 2015-08-26 NOTE — Progress Notes (Signed)
Subjective:     Amy Zimmerman is a 65 y.o. female and is here for a comprehensive physical exam. The patient reports no problems.  Social History   Social History  . Marital Status: Married    Spouse Name: N/A  . Number of Children: N/A  . Years of Education: N/A   Occupational History  . [employer]    Social History Main Topics  . Smoking status: Never Smoker   . Smokeless tobacco: Never Used  . Alcohol Use: No  . Drug Use: No  . Sexual Activity:    Partners: Male   Other Topics Concern  . Not on file   Social History Narrative   Exercising--  Not regular   Health Maintenance  Topic Date Due  . ZOSTAVAX  08/25/2016 (Originally 02/28/2012)  . Hepatitis C Screening  08/25/2016 (Originally [birth year])  . HIV Screening  08/25/2016 (Originally 02/28/1967)  . INFLUENZA VACCINE  10/05/2015  . COLONOSCOPY  04/16/2016  . MAMMOGRAM  10/14/2016  . PAP SMEAR  08/23/2017  . TETANUS/TDAP  03/08/2021    The following portions of the patient's history were reviewed and updated as appropriate:  She  has a past medical history of Hypertension. She  does not have any pertinent problems on file. She  has past surgical history that includes uterine polyps and Colonoscopy. Her family history includes Heart disease in her mother; Heart disease (age of onset: 34) in her father; Hyperlipidemia in her sister and sister; Hypertension in her mother; Kidney disease in her mother. There is no history of Colon cancer or Stomach cancer. She  reports that she has never smoked. She has never used smokeless tobacco. She reports that she does not drink alcohol or use illicit drugs. She has a current medication list which includes the following prescription(s): calcium carbonate, hydrochlorothiazide, and metoprolol succinate. Current Outpatient Prescriptions on File Prior to Visit  Medication Sig Dispense Refill  . calcium carbonate (OS-CAL) 600 MG TABS Take 600 mg by mouth 2 (two)  times daily with a meal.      . hydrochlorothiazide (HYDRODIURIL) 50 MG tablet TAKE ONE TABLET BY MOUTH ONCE DAILY 90 tablet 1  . metoprolol succinate (TOPROL-XL) 100 MG 24 hr tablet TAKE ONE TABLET BY MOUTH ONCE DAILY WITH  OR  IMMEDIATELY  FOLLOWING  A  MEAL 90 tablet 1   No current facility-administered medications on file prior to visit.   She has No Known Allergies..  Review of Systems Review of Systems  Constitutional: Negative for activity change, appetite change and fatigue.  HENT: Negative for hearing loss, congestion, tinnitus and ear discharge.  dentist q74m Eyes: Negative for visual disturbance (see optho q1y -- vision corrected to 20/20 with glasses).  Respiratory: Negative for cough, chest tightness and shortness of breath.   Cardiovascular: Negative for chest pain, palpitations and leg swelling.  Gastrointestinal: Negative for abdominal pain, diarrhea, constipation and abdominal distention.  Genitourinary: Negative for urgency, frequency, decreased urine volume and difficulty urinating.  Musculoskeletal: Negative for back pain, arthralgias and gait problem.  Skin: Negative for color change, pallor and rash.  Neurological: Negative for dizziness, light-headedness, numbness and headaches.  Hematological: Negative for adenopathy. Does not bruise/bleed easily.  Psychiatric/Behavioral: Negative for suicidal ideas, confusion, sleep disturbance, self-injury, dysphoric mood, decreased concentration and agitation.       Objective:    BP 154/84 mmHg  Pulse 67  Temp(Src) 98.4 F (36.9 C) (Oral)  Ht 5' 2.25" (1.581 m)  Wt 145 lb 12.8  oz (66.134 kg)  BMI 26.46 kg/m2  SpO2 98% General appearance: alert, cooperative, appears stated age and no distress Head: Normocephalic, without obvious abnormality, atraumatic Eyes: conjunctivae/corneas clear. PERRL, EOM's intact. Fundi benign. Ears: normal TM's and external ear canals both ears Nose: Nares normal. Septum midline. Mucosa  normal. No drainage or sinus tenderness. Throat: lips, mucosa, and tongue normal; teeth and gums normal Neck: no adenopathy, no carotid bruit, no JVD, supple, symmetrical, trachea midline and thyroid not enlarged, symmetric, no tenderness/mass/nodules Back: symmetric, no curvature. ROM normal. No CVA tenderness. Lungs: clear to auscultation bilaterally Breasts: normal appearance, no masses or tenderness Heart: regular rate and rhythm, S1, S2 normal, no murmur, click, rub or gallop Abdomen: soft, non-tender; bowel sounds normal; no masses,  no organomegaly Pelvic: deferred Extremities: extremities normal, atraumatic, no cyanosis or edema Pulses: 2+ and symmetric Skin: Skin color, texture, turgor normal. No rashes or lesions Lymph nodes: Cervical, supraclavicular, and axillary nodes normal. Neurologic: Alert and oriented X 3, normal strength and tone. Normal symmetric reflexes. Normal coordination and gait    Assessment:    Healthy female exam.      Plan:    ghm utd Check labs See After Visit Summary for Counseling Recommendations    1. Preventative health care  - Comprehensive metabolic panel - CBC with Differential/Platelet - Lipid panel - POCT urinalysis dipstick - TSH - Hepatitis C antibody  2. Essential hypertension con't metoprolol and hctz stable  3. Breast cancer screening  - MM DIGITAL SCREENING BILATERAL; Future

## 2015-08-26 NOTE — Progress Notes (Signed)
Pre visit review using our clinic review tool, if applicable. No additional management support is needed unless otherwise documented below in the visit note. 

## 2015-08-27 LAB — COMPREHENSIVE METABOLIC PANEL
ALBUMIN: 4.4 g/dL (ref 3.5–5.2)
ALT: 21 U/L (ref 0–35)
AST: 20 U/L (ref 0–37)
Alkaline Phosphatase: 69 U/L (ref 39–117)
BUN: 12 mg/dL (ref 6–23)
CALCIUM: 10.3 mg/dL (ref 8.4–10.5)
CHLORIDE: 98 meq/L (ref 96–112)
CO2: 32 mEq/L (ref 19–32)
Creatinine, Ser: 0.69 mg/dL (ref 0.40–1.20)
GFR: 91.19 mL/min (ref >60.00)
Glucose, Bld: 93 mg/dL (ref 70–99)
POTASSIUM: 3.9 meq/L (ref 3.5–5.1)
Sodium: 137 mEq/L (ref 135–145)
Total Bilirubin: 0.9 mg/dL (ref 0.2–1.2)
Total Protein: 7.3 g/dL (ref 6.0–8.3)

## 2015-08-27 LAB — HEPATITIS C ANTIBODY: HCV Ab: NEGATIVE

## 2015-08-27 LAB — CBC WITH DIFFERENTIAL/PLATELET
BASOS ABS: 0.1 10*3/uL (ref 0.0–0.1)
Basophils Relative: 0.9 % (ref 0.0–3.0)
EOS ABS: 0.1 10*3/uL (ref 0.0–0.7)
Eosinophils Relative: 2 % (ref 0.0–5.0)
HCT: 42.2 % (ref 36.0–46.0)
Hemoglobin: 14.3 g/dL (ref 12.0–15.0)
Lymphocytes Relative: 41.4 % (ref 12.0–46.0)
Lymphs Abs: 3.2 10*3/uL (ref 0.7–4.0)
MCHC: 34 g/dL (ref 30.0–36.0)
MCV: 83 fl (ref 78.0–100.0)
Monocytes Absolute: 0.4 10*3/uL (ref 0.1–1.0)
Monocytes Relative: 5.2 % (ref 3.0–12.0)
Neutro Abs: 3.9 10*3/uL (ref 1.4–7.7)
Neutrophils Relative %: 50.5 % (ref 43.0–77.0)
Platelets: 244 10*3/uL (ref 150.0–400.0)
RBC: 5.08 Mil/uL (ref 3.87–5.11)
RDW: 12.9 % (ref 11.5–15.5)
WBC: 7.7 10*3/uL (ref 4.0–10.5)

## 2015-08-27 LAB — LIPID PANEL
Cholesterol: 229 mg/dL — ABNORMAL HIGH (ref 0–200)
HDL: 54.6 mg/dL (ref >39.00)
LDL Cholesterol: 154 mg/dL — ABNORMAL HIGH (ref 0–99)
NONHDL: 174.61
Total CHOL/HDL Ratio: 4
Triglycerides: 103 mg/dL (ref 0.0–149.0)
VLDL: 20.6 mg/dL (ref 0.0–40.0)

## 2015-08-27 LAB — TSH: TSH: 0.03 u[IU]/mL — ABNORMAL LOW (ref 0.35–4.50)

## 2015-08-31 ENCOUNTER — Other Ambulatory Visit: Payer: Self-pay

## 2015-08-31 DIAGNOSIS — E0590 Thyrotoxicosis, unspecified without thyrotoxic crisis or storm: Secondary | ICD-10-CM

## 2015-09-01 ENCOUNTER — Other Ambulatory Visit (INDEPENDENT_AMBULATORY_CARE_PROVIDER_SITE_OTHER): Payer: BLUE CROSS/BLUE SHIELD

## 2015-09-01 DIAGNOSIS — E0590 Thyrotoxicosis, unspecified without thyrotoxic crisis or storm: Secondary | ICD-10-CM | POA: Diagnosis not present

## 2015-09-01 LAB — T3, FREE: T3 FREE: 3.7 pg/mL (ref 2.3–4.2)

## 2015-09-01 LAB — T4, FREE: Free T4: 1.35 ng/dL (ref 0.60–1.60)

## 2015-09-01 LAB — TSH: TSH: 0.18 u[IU]/mL — ABNORMAL LOW (ref 0.35–4.50)

## 2015-09-08 ENCOUNTER — Other Ambulatory Visit: Payer: Self-pay

## 2015-09-08 DIAGNOSIS — E0590 Thyrotoxicosis, unspecified without thyrotoxic crisis or storm: Secondary | ICD-10-CM

## 2015-10-18 ENCOUNTER — Ambulatory Visit
Admission: RE | Admit: 2015-10-18 | Discharge: 2015-10-18 | Disposition: A | Discharge status: Home / Self Care | Payer: BLUE CROSS/BLUE SHIELD | Source: Ambulatory Visit | Attending: Family Medicine | Admitting: Family Medicine

## 2015-10-18 DIAGNOSIS — Z1239 Encounter for other screening for malignant neoplasm of breast: Secondary | ICD-10-CM

## 2015-10-19 ENCOUNTER — Ambulatory Visit (INDEPENDENT_AMBULATORY_CARE_PROVIDER_SITE_OTHER): Payer: BLUE CROSS/BLUE SHIELD | Admitting: Internal Medicine

## 2015-10-19 ENCOUNTER — Encounter: Payer: Self-pay | Admitting: Internal Medicine

## 2015-10-19 VITALS — BP 142/92 | HR 65 | Ht 62.5 in | Wt 151.0 lb

## 2015-10-19 DIAGNOSIS — E0590 Thyrotoxicosis, unspecified without thyrotoxic crisis or storm: Secondary | ICD-10-CM | POA: Diagnosis not present

## 2015-10-19 LAB — T4, FREE: Free T4: 1.04 ng/dL (ref 0.60–1.60)

## 2015-10-19 LAB — TSH: TSH: 0.02 u[IU]/mL — ABNORMAL LOW (ref 0.35–4.50)

## 2015-10-19 LAB — T3, FREE: T3, Free: 3.3 pg/mL (ref 2.3–4.2)

## 2015-10-19 NOTE — Patient Instructions (Signed)
You have "subclinical thyrotoxicosis".  Please stop at the lab.  Please come back for a follow-up appointment in 6 months.

## 2015-10-19 NOTE — Progress Notes (Addendum)
Patient ID: Amy Zimmerman, female   DOB: 05/16/1950, 65 y.o.   MRN: [id number]    HPI  SKILER GARTH is a 65 y.o.-year-old female, referred by her PCP, Dr. Etter Sjogren, for evaluation for subclinical thyrotoxicosis.  She was found to have low TSH in 08/2015. She had a repeat low TSH, which has improved though, 6 days later. The free hormones were normal.   I reviewed pt's thyroid tests: Lab Results  Component Value Date   TSH 0.18 (L) 09/01/2015   TSH 0.03 (L) 08/26/2015   TSH 2.72 08/24/2014   TSH 1.15 07/23/2013   TSH 1.58 05/17/2012   TSH 1.57 03/14/2011   TSH 4.39 01/07/2010   TSH 3.75 04/28/2008   FREET4 1.35 09/01/2015   FREET4 0.93 01/07/2010    Pt denies feeling nodules in neck, hoarseness, dysphagia/odynophagia, SOB with lying down; she denies: - fatigue - excessive sweating/heat intolerance - tremors - anxiety - palpitations - hyperdefecation - weight loss - hair loss  Pt does have a FH of thyroid ds: mother and older sister. No FH of thyroid cancer. No h/o radiation tx to head or neck.  No seaweed or kelp, no recent contrast studies. No steroid use. No Biotin use.  No URI before the thyroid check.  She has taken supplements starting in 05/2014 (Nutrimost) >> 40 days >> then stopped. No recent herbal supplements.  Pt. also has a history of HTN, HL.  ROS: Constitutional: no weight gain/loss, no fatigue, no subjective hyperthermia/hypothermia Eyes: no blurry vision, no xerophthalmia ENT: no sore throat, no nodules palpated in throat, no dysphagia/odynophagia, no hoarseness Cardiovascular: no CP/SOB/palpitations/leg swelling Respiratory: no cough/SOB Gastrointestinal: no N/V/D/C Musculoskeletal: no muscle/joint aches Skin: no rashes Neurological: no tremors/numbness/tingling/dizziness Psychiatric: no depression/anxiety  Past Medical History:  Diagnosis Date  . Hypertension    Past Surgical History:  Procedure Laterality Date  . COLONOSCOPY    . uterine  polyps     Social History   Social History  . Marital status: Married    Spouse name: N/A  . Number of children: 2   Occupational History  . [employer] Customer care rep for [employer]   Social History Main Topics  . Smoking status: Never Smoker  . Smokeless tobacco: Never Used  . Alcohol use No  . Drug use: No   Current Outpatient Prescriptions on File Prior to Visit  Medication Sig Dispense Refill  . calcium carbonate (OS-CAL) 600 MG TABS Take 600 mg by mouth 2 (two) times daily with a meal.      . hydrochlorothiazide (HYDRODIURIL) 50 MG tablet TAKE ONE TABLET BY MOUTH ONCE DAILY 90 tablet 1  . metoprolol succinate (TOPROL-XL) 100 MG 24 hr tablet TAKE ONE TABLET BY MOUTH ONCE DAILY WITH  OR  IMMEDIATELY  FOLLOWING  A  MEAL 90 tablet 1   No current facility-administered medications on file prior to visit.    No Known Allergies Family History  Problem Relation Age of Onset  . Heart disease Mother   . Kidney disease Mother   . Hypertension Mother   . Heart disease Father 70    MI  . Hyperlipidemia Sister   . Hyperlipidemia Sister   . Colon cancer Neg Hx   . Stomach cancer Neg Hx    PE: BP (!) 142/92 (BP Location: Left Arm, Patient Position: Sitting)   Pulse 65   Ht 5' 2.5" (1.588 m)   Wt 151 lb (68.5 kg)   SpO2 97%   BMI  27.18 kg/m  Wt Readings from Last 3 Encounters:  10/19/15 151 lb (68.5 kg)  08/26/15 145 lb 12.8 oz (66.1 kg)  09/24/14 139 lb (63 kg)   Constitutional: Normal weight, in NAD Eyes: PERRLA, EOMI, no exophthalmos, no lid lag, no stare ENT: moist mucous membranes, no thyromegaly, no thyroid bruits, no cervical lymphadenopathy Cardiovascular: RRR, No MRG Respiratory: CTA B Gastrointestinal: abdomen soft, NT, ND, BS+ Musculoskeletal: no deformities, strength intact in all 4 Skin: moist, warm, no rashes Neurological: no tremor with outstretched hands, DTR normal in all 4  ASSESSMENT: 1. Subclinical Thyrotoxicosis  PLAN:  1.  Patient with a recently found low TSH - x2, without thyrotoxic sxs: weight loss, heat intolerance, hyperdefecation, palpitations, anxiety.  - she does not appear to have exogenous causes for the low TSH.  - We discussed that possible causes of thyrotoxicosis are:  Graves ds   Thyroiditis toxic multinodular goiter/ toxic adenoma (I cannot feel nodules at palpation of her thyroid). - I suggested that we check the TSH, fT3 and fT4  - If the tests remain abnormal, we may need an uptake and scan to differentiate between the 3 above possible etiologies  - we discussed about possible modalities of treatment for the above conditions, to include methimazole use, radioactive iodine ablation or (last resort) surgery. - we might need to do thyroid ultrasound depending on the results of the uptake and scan (if a cold nodule is present) - I do not feel that we need to add beta blockers at this time, since she is not tachycardic, anxious, or tremulous - RTC in 6 months, but likely sooner for repeat labs  Component     Latest Ref Rng & Units 10/19/2015  TSH     0.35 - 4.50 uIU/mL 0.02 (L)  Triiodothyronine,Free,Serum     2.3 - 4.2 pg/mL 3.3  T4,Free(Direct)     0.60 - 1.60 ng/dL 1.04   The TSH is lower than before. I would suggest to obtain a thyroid uptake and scan. No intervention needed for now, since asymptomatic subclinical hyperthyroidism, but I would like to recheck her tests in 1.5 months.  Reading Physician Reading Date Result Priority  Gus Height, MD 10/29/2015   Narrative    CLINICAL DATA: Evaluate for sub clinical thyrotoxicosis. TSH equal 0.02  EXAM: THYROID SCAN AND UPTAKE - 24 HOURS  TECHNIQUE: Following the per oral administration of I-131 sodium iodide, the patient returned at 24 hours and uptake measurements were acquired with the uptake probe centered on the neck. Thyroid imaging was performed following the intravenous administration of the  Tc-75m Pertechnetate.  RADIOPHARMACEUTICALS: 7.9 MicroCuries I-131 sodium iodide orally and 10.1 mCi Technetium-12m pertechnetate IV  COMPARISON: None  FINDINGS: There is mild heterogeneity of uptake within the thyroid gland which is mildly enlarged.  24 hour I 131 uptake = 16% (normal 10-30%)  IMPRESSION: Heterogeneity uptake within mildly enlarged gland with suppressed TSH and normal uptake would be consistent with subacute thyroiditis or toxic multinodular goiter.   Electronically Signed By: Suzy Bouchard M.D. On: 10/29/2015 15:03   The thyroid uptake and scan shows normal uptake. The scan is consistent with either subacute thyroiditis or toxic multinodular goiter. Since the patient is asymptomatic, I would like to repeat her thyroid tests in 2 months, but no intervention is needed for now.  Philemon Kingdom, MD PhD [facility] Endocr[facility]

## 2015-10-28 ENCOUNTER — Encounter (HOSPITAL_COMMUNITY)
Admission: RE | Admit: 2015-10-28 | Discharge: 2015-10-28 | Disposition: A | Discharge status: Home / Self Care | Payer: BLUE CROSS/BLUE SHIELD | Source: Ambulatory Visit | Attending: Internal Medicine | Admitting: Internal Medicine

## 2015-10-28 DIAGNOSIS — E0590 Thyrotoxicosis, unspecified without thyrotoxic crisis or storm: Secondary | ICD-10-CM | POA: Diagnosis not present

## 2015-10-28 MED ORDER — SODIUM IODIDE I 131 CAPSULE
7.9000 | Freq: Once | INTRAVENOUS | Status: AC
  Administered 2015-10-28: 7.9 via ORAL

## 2015-10-29 ENCOUNTER — Encounter (HOSPITAL_COMMUNITY)
Admission: RE | Admit: 2015-10-29 | Discharge: 2015-10-29 | Disposition: A | Discharge status: Home / Self Care | Payer: BLUE CROSS/BLUE SHIELD | Source: Ambulatory Visit | Attending: Internal Medicine | Admitting: Internal Medicine

## 2015-10-29 DIAGNOSIS — E0590 Thyrotoxicosis, unspecified without thyrotoxic crisis or storm: Secondary | ICD-10-CM | POA: Diagnosis not present

## 2015-10-29 MED ORDER — SODIUM PERTECHNETATE TC 99M INJECTION
10.1000 | Freq: Once | INTRAVENOUS | Status: AC | PRN
  Administered 2015-10-29: 10.1 via INTRAVENOUS

## 2016-01-10 ENCOUNTER — Other Ambulatory Visit: Payer: Self-pay | Admitting: Family Medicine

## 2016-03-31 ENCOUNTER — Ambulatory Visit (INDEPENDENT_AMBULATORY_CARE_PROVIDER_SITE_OTHER): Payer: BLUE CROSS/BLUE SHIELD | Admitting: Family Medicine

## 2016-03-31 ENCOUNTER — Encounter: Payer: Self-pay | Admitting: Family Medicine

## 2016-03-31 VITALS — BP 148/96 | HR 78 | Temp 98.3°F | Ht 63.0 in | Wt 155.0 lb

## 2016-03-31 DIAGNOSIS — R030 Elevated blood-pressure reading, without diagnosis of hypertension: Secondary | ICD-10-CM

## 2016-03-31 DIAGNOSIS — J208 Acute bronchitis due to other specified organisms: Secondary | ICD-10-CM

## 2016-03-31 DIAGNOSIS — B9689 Other specified bacterial agents as the cause of diseases classified elsewhere: Secondary | ICD-10-CM

## 2016-03-31 MED ORDER — AZITHROMYCIN 250 MG PO TABS: ORAL_TABLET | ORAL | 0 refills | Status: DC

## 2016-03-31 MED ORDER — BENZONATATE 100 MG PO CAPS: 100.0000 mg | ORAL_CAPSULE | Freq: Two times a day (BID) | ORAL | 0 refills | Status: DC | PRN

## 2016-03-31 NOTE — Progress Notes (Signed)
Pre visit review using our clinic review tool, if applicable. No additional management support is needed unless otherwise documented below in the visit note. 

## 2016-03-31 NOTE — Patient Instructions (Addendum)
Continue to push fluids, practice good hand hygiene, and cover your mouth if you cough.  Wait 2 days before taking antibiotic (azithromycin).   Take your blood pressure 3 times per week, at different times of the day. Write it down and bring this BP log to your nurse visit in 2 weeks. Bring your blood pressure cuff as well.

## 2016-03-31 NOTE — Progress Notes (Signed)
Chief Complaint  Patient presents with  . Cough    Pt reports head congestion with cough x2 weeks with no relief    [place] here for URI complaints.  Duration: 2 weeks  Associated symptoms: sinus congestion, rhinorrhea and cough Denies: itchy watery eyes, ear pain, ear drainage, sore throat, wheezing, shortness of breath, fever, rigors and myalgia Treatment to date: Homemade tonic drink, echinacea, Vit C Sick contacts: Yes- spouse  Hypertension Patient presents for hypertension follow up. She does not monitor home blood pressures. She does have access to a home BP cuff. She is compliant with medications. Patient has these side effects of medication: none She is sometimes adhering to a healthy diet overall.  ROS:   Const: Denies fevers HEENT: As noted in HPI Lungs: No SOB  Past Medical History:  Diagnosis Date  . Hypertension    Family History  Problem Relation Age of Onset  . Heart disease Mother   . Kidney disease Mother   . Hypertension Mother   . Heart disease Father 57    MI  . Hyperlipidemia Sister   . Hyperlipidemia Sister   . Colon cancer Neg Hx   . Stomach cancer Neg Hx     BP (!) 148/96 (BP Location: Left Arm, Patient Position: Sitting, Cuff Size: Small)   Pulse 78   Temp 98.3 F (36.8 C) (Oral)   Ht 5\' 3"  (1.6 m)   Wt 155 lb (70.3 kg)   SpO2 96%   BMI 27.46 kg/m  General: Awake, alert, appears stated age HEENT: AT, [place], ears patent b/l and TM's neg, nares patent w/o discharge, pharynx pink and without exudates, MMM Neck: No masses or asymmetry Heart: RRR, no murmurs, no bruits Lungs: CTAB, no accessory muscle use Psych: Age appropriate judgment and insight, normal mood and affect  Acute bacterial bronchitis - Plan: azithromycin (ZITHROMAX) 250 MG tablet, benzonatate (TESSALON) 100 MG capsule  Elevated blood pressure reading  Orders as above. Supportive care for 2 more days then use abx if no improvement or if worsening. Keep BP log  over next 2 weeks and f/u with [place] for nurse visit. Bring cuff and BP log.  Continue to push fluids, practice good hand hygiene, cover mouth when coughing. F/u in 2 weeks for nurse visit. Pt voiced understanding and agreement to the plan.  [place], DO 03/31/16 3:37 PM

## 2016-04-14 ENCOUNTER — Ambulatory Visit (INDEPENDENT_AMBULATORY_CARE_PROVIDER_SITE_OTHER): Payer: BLUE CROSS/BLUE SHIELD | Admitting: Family Medicine

## 2016-04-14 DIAGNOSIS — I10 Essential (primary) hypertension: Secondary | ICD-10-CM | POA: Diagnosis not present

## 2016-04-14 NOTE — Progress Notes (Signed)
Noted. Home readings are significantly lower than here, also compared home BP monitor with ours and they are similar. Ryback coat syndrome is likely. I will have her f/u with PCP in 2-4 weeks for f/u as her home readings are acceptable (goal of <150/90 according to [facility] 8). If her PCP has a lower goal, this can be discussed at f/u.

## 2016-04-14 NOTE — Progress Notes (Signed)
Pre visit review using our clinic tool,if applicable. No additional management support is needed unless otherwise documented below in the visit note.   BP = 179/86 P=74  Per Dr. Nani Ravens patient to continue medications as she is taking them and return in 1 week for follow up appointment with Dr. Etter Sjogren. Appointment scheduled for patient.Marland Kitchen

## 2016-04-17 ENCOUNTER — Encounter: Payer: Self-pay | Admitting: Internal Medicine

## 2016-04-20 ENCOUNTER — Ambulatory Visit: Payer: BLUE CROSS/BLUE SHIELD | Admitting: Internal Medicine

## 2016-05-19 ENCOUNTER — Encounter: Payer: Self-pay | Admitting: Family Medicine

## 2016-05-19 ENCOUNTER — Ambulatory Visit (INDEPENDENT_AMBULATORY_CARE_PROVIDER_SITE_OTHER): Payer: BLUE CROSS/BLUE SHIELD | Admitting: Family Medicine

## 2016-05-19 VITALS — BP 140/88 | HR 80 | Temp 98.5°F | Resp 16 | Ht 63.0 in | Wt 155.6 lb

## 2016-05-19 DIAGNOSIS — E785 Hyperlipidemia, unspecified: Secondary | ICD-10-CM

## 2016-05-19 DIAGNOSIS — I10 Essential (primary) hypertension: Secondary | ICD-10-CM

## 2016-05-19 LAB — LIPID PANEL
CHOL/HDL RATIO: 4
Cholesterol: 246 mg/dL — ABNORMAL HIGH (ref 0–200)
HDL: 55.9 mg/dL (ref >39.00)
LDL Cholesterol: 151 mg/dL — ABNORMAL HIGH (ref 0–99)
NONHDL: 190.48
TRIGLYCERIDES: 197 mg/dL — AB (ref 0.0–149.0)
VLDL: 39.4 mg/dL (ref 0.0–40.0)

## 2016-05-19 LAB — COMPREHENSIVE METABOLIC PANEL
ALK PHOS: 67 U/L (ref 39–117)
ALT: 25 U/L (ref 0–35)
AST: 22 U/L (ref 0–37)
Albumin: 4.3 g/dL (ref 3.5–5.2)
BUN: 13 mg/dL (ref 6–23)
CALCIUM: 10.5 mg/dL (ref 8.4–10.5)
CO2: 31 meq/L (ref 19–32)
Chloride: 96 mEq/L (ref 96–112)
Creatinine, Ser: 0.77 mg/dL (ref 0.40–1.20)
GFR: 80.16 mL/min (ref >60.00)
GLUCOSE: 88 mg/dL (ref 70–99)
Potassium: 3.6 mEq/L (ref 3.5–5.1)
Sodium: 134 mEq/L — ABNORMAL LOW (ref 135–145)
Total Bilirubin: 0.5 mg/dL (ref 0.2–1.2)
Total Protein: 7.2 g/dL (ref 6.0–8.3)

## 2016-05-19 MED ORDER — HYDROCHLOROTHIAZIDE 50 MG PO TABS: 50.0000 mg | ORAL_TABLET | Freq: Every day | ORAL | 1 refills | Status: DC

## 2016-05-19 MED ORDER — METOPROLOL SUCCINATE ER 100 MG PO TB24: ORAL_TABLET | ORAL | 1 refills | Status: DC

## 2016-05-19 NOTE — Assessment & Plan Note (Addendum)
Poorly controlled , encouraged DASH diet, minimize caffeine and obtain adequate sleep. Report concerning symptoms and follow up as directed and as needed Home bp readings up and down

## 2016-05-19 NOTE — Patient Instructions (Signed)
DASH Eating Plan DASH stands for "Dietary Approaches to Stop Hypertension." The DASH eating plan is a healthy eating plan that has been shown to reduce high blood pressure (hypertension). It may also reduce your risk for type 2 diabetes, heart disease, and stroke. The DASH eating plan may also help with weight loss. What are tips for following this plan? General guidelines  Avoid eating more than 2,300 mg (milligrams) of salt (sodium) a day. If you have hypertension, you may need to reduce your sodium intake to 1,500 mg a day.  Limit alcohol intake to no more than 1 drink a day for nonpregnant women and 2 drinks a day for men. One drink equals 12 oz of beer, 5 oz of wine, or 1 oz of hard liquor.  Work with your health care provider to maintain a healthy body weight or to lose weight. Ask what an ideal weight is for you.  Get at least 30 minutes of exercise that causes your heart to beat faster (aerobic exercise) most days of the week. Activities may include walking, swimming, or biking.  Work with your health care provider or diet and nutrition specialist (dietitian) to adjust your eating plan to your individual calorie needs. Reading food labels  Check food labels for the amount of sodium per serving. Choose foods with less than 5 percent of the Daily Value of sodium. Generally, foods with less than 300 mg of sodium per serving fit into this eating plan.  To find whole grains, look for the word "whole" as the first word in the ingredient list. Shopping  Buy products labeled as "low-sodium" or "no salt added."  Buy fresh foods. Avoid canned foods and premade or frozen meals. Cooking  Avoid adding salt when cooking. Use salt-free seasonings or herbs instead of table salt or sea salt. Check with your health care provider or pharmacist before using salt substitutes.  Do not fry foods. Cook foods using healthy methods such as baking, boiling, grilling, and broiling instead.  Cook with  heart-healthy oils, such as olive, canola, soybean, or sunflower oil. Meal planning   Eat a balanced diet that includes: ? 5 or more servings of fruits and vegetables each day. At each meal, try to fill half of your plate with fruits and vegetables. ? Up to 6-8 servings of whole grains each day. ? Less than 6 oz of lean meat, poultry, or fish each day. A 3-oz serving of meat is about the same size as a deck of cards. One egg equals 1 oz. ? 2 servings of low-fat dairy each day. ? A serving of nuts, seeds, or beans 5 times each week. ? Heart-healthy fats. Healthy fats called Omega-3 fatty acids are found in foods such as flaxseeds and coldwater fish, like sardines, salmon, and mackerel.  Limit how much you eat of the following: ? Canned or prepackaged foods. ? Food that is high in trans fat, such as fried foods. ? Food that is high in saturated fat, such as fatty meat. ? Sweets, desserts, sugary drinks, and other foods with added sugar. ? Full-fat dairy products.  Do not salt foods before eating.  Try to eat at least 2 vegetarian meals each week.  Eat more home-cooked food and less restaurant, buffet, and fast food.  When eating at a restaurant, ask that your food be prepared with less salt or no salt, if possible. What foods are recommended? The items listed may not be a complete list. Talk with your dietitian about what   dietary choices are best for you. Grains Whole-grain or whole-wheat bread. Whole-grain or whole-wheat pasta. Brown rice. Oatmeal. Quinoa. Bulgur. Whole-grain and low-sodium cereals. Pita bread. Low-fat, low-sodium crackers. Whole-wheat flour tortillas. Vegetables Fresh or frozen vegetables (raw, steamed, roasted, or grilled). Low-sodium or reduced-sodium tomato and vegetable juice. Low-sodium or reduced-sodium tomato sauce and tomato paste. Low-sodium or reduced-sodium canned vegetables. Fruits All fresh, dried, or frozen fruit. Canned fruit in natural juice (without  added sugar). Meat and other protein foods Skinless chicken or turkey. Ground chicken or turkey. Pork with fat trimmed off. Fish and seafood. Egg whites. Dried beans, peas, or lentils. Unsalted nuts, nut butters, and seeds. Unsalted canned beans. Lean cuts of beef with fat trimmed off. Low-sodium, lean deli meat. Dairy Low-fat (1%) or fat-free (skim) milk. Fat-free, low-fat, or reduced-fat cheeses. Nonfat, low-sodium ricotta or cottage cheese. Low-fat or nonfat yogurt. Low-fat, low-sodium cheese. Fats and oils Soft margarine without trans fats. Vegetable oil. Low-fat, reduced-fat, or light mayonnaise and salad dressings (reduced-sodium). Canola, safflower, olive, soybean, and sunflower oils. Avocado. Seasoning and other foods Herbs. Spices. Seasoning mixes without salt. Unsalted popcorn and pretzels. Fat-free sweets. What foods are not recommended? The items listed may not be a complete list. Talk with your dietitian about what dietary choices are best for you. Grains Baked goods made with fat, such as croissants, muffins, or some breads. Dry pasta or rice meal packs. Vegetables Creamed or fried vegetables. Vegetables in a cheese sauce. Regular canned vegetables (not low-sodium or reduced-sodium). Regular canned tomato sauce and paste (not low-sodium or reduced-sodium). Regular tomato and vegetable juice (not low-sodium or reduced-sodium). Pickles. Olives. Fruits Canned fruit in a light or heavy syrup. Fried fruit. Fruit in cream or butter sauce. Meat and other protein foods Fatty cuts of meat. Ribs. Fried meat. Bacon. Sausage. Bologna and other processed lunch meats. Salami. Fatback. Hotdogs. Bratwurst. Salted nuts and seeds. Canned beans with added salt. Canned or smoked fish. Whole eggs or egg yolks. Chicken or turkey with skin. Dairy Whole or 2% milk, cream, and half-and-half. Whole or full-fat cream cheese. Whole-fat or sweetened yogurt. Full-fat cheese. Nondairy creamers. Whipped toppings.  Processed cheese and cheese spreads. Fats and oils Butter. Stick margarine. Lard. Shortening. Ghee. Bacon fat. Tropical oils, such as coconut, palm kernel, or palm oil. Seasoning and other foods Salted popcorn and pretzels. Onion salt, garlic salt, seasoned salt, table salt, and sea salt. Worcestershire sauce. Tartar sauce. Barbecue sauce. Teriyaki sauce. Soy sauce, including reduced-sodium. Steak sauce. Canned and packaged gravies. Fish sauce. Oyster sauce. Cocktail sauce. Horseradish that you find on the shelf. Ketchup. Mustard. Meat flavorings and tenderizers. Bouillon cubes. Hot sauce and Tabasco sauce. Premade or packaged marinades. Premade or packaged taco seasonings. Relishes. Regular salad dressings. Where to find more information:  National Heart, Lung, and Blood Institute: www.nhlbi.nih.gov  American Heart Association: www.heart.org Summary  The DASH eating plan is a healthy eating plan that has been shown to reduce high blood pressure (hypertension). It may also reduce your risk for type 2 diabetes, heart disease, and stroke.  With the DASH eating plan, you should limit salt (sodium) intake to 2,300 mg a day. If you have hypertension, you may need to reduce your sodium intake to 1,500 mg a day.  When on the DASH eating plan, aim to eat more fresh fruits and vegetables, whole grains, lean proteins, low-fat dairy, and heart-healthy fats.  Work with your health care provider or diet and nutrition specialist (dietitian) to adjust your eating plan to your individual   calorie needs. This information is not intended to replace advice given to you by your health care provider. Make sure you discuss any questions you have with your health care provider. Document Released: 02/09/2011 Document Revised: 02/14/2016 Document Reviewed: 02/14/2016 Elsevier Interactive Patient Education  2017 Elsevier Inc.  

## 2016-05-19 NOTE — Progress Notes (Signed)
Pre visit review using our clinic review tool, if applicable. No additional management support is needed unless otherwise documented below in the visit note. 

## 2016-05-19 NOTE — Progress Notes (Signed)
Patient ID: Amy Zimmerman, female   DOB: 07-Jul-1950, 66 y.o.   MRN: [id number]     Subjective:  I acted as a Education administrator for Dr. Carollee Herter.  Guerry Bruin, [place]   Patient ID: Amy Zimmerman, female    DOB: 28-Dec-1950, 66 y.o.   MRN: [id number]    HPI  Patient is in today for follow up blood pressure.  She brought in a few days of readings: 3/5 139/83 3/6 120/76 3/7 133/81 3/9 140/85 3/13 113/73 3/15 116/73 3/16 124/80    Patient Care Team: Ann Held, DO as PCP - General Devra Dopp, MD as Referring Physician (Dermatology)   Past Medical History:  Diagnosis Date  . Hypertension     Past Surgical History:  Procedure Laterality Date  . COLONOSCOPY    . uterine polyps      Family History  Problem Relation Age of Onset  . Heart disease Mother   . Kidney disease Mother   . Hypertension Mother   . Heart disease Father 49    MI  . Hyperlipidemia Sister   . Hyperlipidemia Sister   . Colon cancer Neg Hx   . Stomach cancer Neg Hx     Social History   Social History  . Marital status: Married    Spouse name: N/A  . Number of children: N/A  . Years of education: N/A   Occupational History  . [employer]    Social History Main Topics  . Smoking status: Never Smoker  . Smokeless tobacco: Never Used  . Alcohol use No  . Drug use: No  . Sexual activity: Yes    Partners: Male   Other Topics Concern  . Not on file   Social History Narrative   Exercising--  Not regular    Outpatient Medications Prior to Visit  Medication Sig Dispense Refill  . calcium carbonate (OS-CAL) 600 MG TABS Take 600 mg by mouth 2 (two) times daily with a meal.      . hydrochlorothiazide (HYDRODIURIL) 50 MG tablet TAKE ONE TABLET BY MOUTH ONCE DAILY 90 tablet 1  . metoprolol succinate (TOPROL-XL) 100 MG 24 hr tablet TAKE ONE TABLET BY MOUTH ONCE DAILY WITH  OR  IMMEDIATELY  FOLLOWING  A  MEAL 90 tablet 1  . benzonatate (TESSALON) 100 MG capsule Take 1  capsule (100 mg total) by mouth 2 (two) times daily as needed for cough. (Patient not taking: Reported on 04/14/2016) 20 capsule 0   No facility-administered medications prior to visit.     No Known Allergies  Review of Systems  Constitutional: Negative for fever and malaise/fatigue.  HENT: Negative for congestion.   Eyes: Negative for blurred vision.  Respiratory: Negative for cough and shortness of breath.   Cardiovascular: Negative for chest pain, palpitations and leg swelling.  Gastrointestinal: Negative for vomiting.  Musculoskeletal: Negative for back pain.  Skin: Negative for rash.  Neurological: Negative for loss of consciousness and headaches.       Objective:    Physical Exam  Constitutional: She is oriented to person, place, and time. She appears well-developed and well-nourished. No distress.  HENT:  Head: Normocephalic and atraumatic.  Eyes: Conjunctivae are normal.  Neck: Normal range of motion. No thyromegaly present.  Cardiovascular: Normal rate and regular rhythm.   Pulmonary/Chest: Effort normal and breath sounds normal. She has no wheezes.  Abdominal: Soft. Bowel sounds are normal. There is no tenderness.  Musculoskeletal: Normal range of motion. She exhibits no edema  or deformity.  Neurological: She is alert and oriented to person, place, and time.  Skin: Skin is warm and dry. She is not diaphoretic.  Psychiatric: She has a normal mood and affect.    BP 140/88   Pulse 80   Temp 98.5 F (36.9 C) (Oral)   Resp 16   Ht 5\' 3"  (1.6 m)   Wt 155 lb 9.6 oz (70.6 kg)   SpO2 96%   BMI 27.56 kg/m  Wt Readings from Last 3 Encounters:  05/19/16 155 lb 9.6 oz (70.6 kg)  03/31/16 155 lb (70.3 kg)  10/19/15 151 lb (68.5 kg)      Immunization History  Administered Date(s) Administered  . Influenza Whole 12/23/2008, 12/05/2010, 01/05/2012  . Tdap 03/09/2011    Health Maintenance  Topic Date Due  . COLONOSCOPY  04/16/2016  . INFLUENZA VACCINE  06/03/2016  (Originally 10/05/2015)  . HIV Screening  08/25/2016 (Originally 02/28/1967)  . PAP SMEAR  08/23/2017  . MAMMOGRAM  10/17/2017  . TETANUS/TDAP  03/08/2021  . Hepatitis C Screening  Completed    Lab Results  Component Value Date   WBC 7.7 08/26/2015   HGB 14.3 08/26/2015   HCT 42.2 08/26/2015   PLT 244.0 08/26/2015   GLUCOSE 88 05/19/2016   CHOL 246 (H) 05/19/2016   TRIG 197.0 (H) 05/19/2016   HDL 55.90 05/19/2016   LDLDIRECT 159.9 11/21/2012   LDLCALC 151 (H) 05/19/2016   ALT 25 05/19/2016   AST 22 05/19/2016   NA 134 (L) 05/19/2016   K 3.6 05/19/2016   CL 96 05/19/2016   CREATININE 0.77 05/19/2016   BUN 13 05/19/2016   CO2 31 05/19/2016   TSH 0.02 (L) 10/19/2015   HGBA1C 5.5 02/18/2007    Lab Results  Component Value Date   TSH 0.02 (L) 10/19/2015   Lab Results  Component Value Date   WBC 7.7 08/26/2015   HGB 14.3 08/26/2015   HCT 42.2 08/26/2015   MCV 83.0 08/26/2015   PLT 244.0 08/26/2015   Lab Results  Component Value Date   NA 134 (L) 05/19/2016   K 3.6 05/19/2016   CO2 31 05/19/2016   GLUCOSE 88 05/19/2016   BUN 13 05/19/2016   CREATININE 0.77 05/19/2016   BILITOT 0.5 05/19/2016   ALKPHOS 67 05/19/2016   AST 22 05/19/2016   ALT 25 05/19/2016   PROT 7.2 05/19/2016   ALBUMIN 4.3 05/19/2016   CALCIUM 10.5 05/19/2016   GFR 80.16 05/19/2016   Lab Results  Component Value Date   CHOL 246 (H) 05/19/2016   Lab Results  Component Value Date   HDL 55.90 05/19/2016   Lab Results  Component Value Date   LDLCALC 151 (H) 05/19/2016   Lab Results  Component Value Date   TRIG 197.0 (H) 05/19/2016   Lab Results  Component Value Date   CHOLHDL 4 05/19/2016   Lab Results  Component Value Date   HGBA1C 5.5 02/18/2007         Assessment & Plan:   Problem List Items Addressed This Visit      Unprioritized   Essential hypertension - Primary    Poorly controlled , encouraged DASH diet, minimize caffeine and obtain adequate sleep. Report  concerning symptoms and follow up as directed and as needed Home bp readings up and down      Relevant Medications   hydrochlorothiazide (HYDRODIURIL) 50 MG tablet   metoprolol succinate (TOPROL-XL) 100 MG 24 hr tablet   Other Relevant Orders  Comprehensive metabolic panel (Completed)    Other Visit Diagnoses    Hyperlipidemia, unspecified hyperlipidemia type       Relevant Medications   hydrochlorothiazide (HYDRODIURIL) 50 MG tablet   metoprolol succinate (TOPROL-XL) 100 MG 24 hr tablet   Other Relevant Orders   Lipid panel (Completed)      I have discontinued Ms. Weesner's benzonatate. I have also changed her hydrochlorothiazide. Additionally, I am having her maintain her calcium carbonate and metoprolol succinate.  Meds ordered this encounter  Medications  . hydrochlorothiazide (HYDRODIURIL) 50 MG tablet    Sig: Take 1 tablet (50 mg total) by mouth daily.    Dispense:  90 tablet    Refill:  1  . metoprolol succinate (TOPROL-XL) 100 MG 24 hr tablet    Sig: TAKE ONE TABLET BY MOUTH ONCE DAILY WITH  OR  IMMEDIATELY  FOLLOWING  A  MEAL    Dispense:  90 tablet    Refill:  1    CMA served as scribe during this visit. History, Physical and Plan performed by medical provider. Documentation and orders reviewed and attested to.  Ann Held, DO

## 2016-05-22 ENCOUNTER — Other Ambulatory Visit: Payer: Self-pay | Admitting: Family Medicine

## 2016-05-22 ENCOUNTER — Encounter: Payer: Self-pay | Admitting: Family Medicine

## 2016-05-22 DIAGNOSIS — E785 Hyperlipidemia, unspecified: Secondary | ICD-10-CM

## 2016-06-19 ENCOUNTER — Other Ambulatory Visit: Payer: Self-pay | Admitting: Family Medicine

## 2016-06-19 DIAGNOSIS — I10 Essential (primary) hypertension: Secondary | ICD-10-CM

## 2016-06-19 MED ORDER — HYDROCHLOROTHIAZIDE 50 MG PO TABS: 50.0000 mg | ORAL_TABLET | Freq: Every day | ORAL | 1 refills | Status: DC

## 2016-06-19 MED ORDER — METOPROLOL SUCCINATE ER 100 MG PO TB24: ORAL_TABLET | ORAL | 1 refills | Status: DC

## 2016-08-22 ENCOUNTER — Other Ambulatory Visit (INDEPENDENT_AMBULATORY_CARE_PROVIDER_SITE_OTHER): Payer: BLUE CROSS/BLUE SHIELD

## 2016-08-22 DIAGNOSIS — E785 Hyperlipidemia, unspecified: Secondary | ICD-10-CM

## 2016-08-22 LAB — COMPREHENSIVE METABOLIC PANEL
ALK PHOS: 60 U/L (ref 39–117)
ALT: 23 U/L (ref 0–35)
AST: 20 U/L (ref 0–37)
Albumin: 4.3 g/dL (ref 3.5–5.2)
BUN: 15 mg/dL (ref 6–23)
CHLORIDE: 97 meq/L (ref 96–112)
CO2: 29 meq/L (ref 19–32)
Calcium: 10.1 mg/dL (ref 8.4–10.5)
Creatinine, Ser: 0.81 mg/dL (ref 0.40–1.20)
GFR: 75.55 mL/min (ref >60.00)
GLUCOSE: 92 mg/dL (ref 70–99)
POTASSIUM: 3.1 meq/L — AB (ref 3.5–5.1)
SODIUM: 135 meq/L (ref 135–145)
TOTAL PROTEIN: 7 g/dL (ref 6.0–8.3)
Total Bilirubin: 0.8 mg/dL (ref 0.2–1.2)

## 2016-08-22 LAB — LIPID PANEL
CHOL/HDL RATIO: 5
Cholesterol: 280 mg/dL — ABNORMAL HIGH (ref 0–200)
HDL: 55.1 mg/dL (ref >39.00)
LDL Cholesterol: 196 mg/dL — ABNORMAL HIGH (ref 0–99)
NONHDL: 224.53
Triglycerides: 145 mg/dL (ref 0.0–149.0)
VLDL: 29 mg/dL (ref 0.0–40.0)

## 2016-08-23 ENCOUNTER — Other Ambulatory Visit: Payer: Self-pay | Admitting: Family Medicine

## 2016-08-23 DIAGNOSIS — E876 Hypokalemia: Secondary | ICD-10-CM

## 2016-08-23 DIAGNOSIS — E785 Hyperlipidemia, unspecified: Secondary | ICD-10-CM

## 2016-11-23 ENCOUNTER — Other Ambulatory Visit (INDEPENDENT_AMBULATORY_CARE_PROVIDER_SITE_OTHER): Payer: BLUE CROSS/BLUE SHIELD

## 2016-11-23 DIAGNOSIS — E785 Hyperlipidemia, unspecified: Secondary | ICD-10-CM

## 2016-11-23 DIAGNOSIS — E876 Hypokalemia: Secondary | ICD-10-CM | POA: Diagnosis not present

## 2016-11-23 LAB — LIPID PANEL
CHOL/HDL RATIO: 4
Cholesterol: 252 mg/dL — ABNORMAL HIGH (ref 0–200)
HDL: 64.6 mg/dL (ref >39.00)
LDL Cholesterol: 169 mg/dL — ABNORMAL HIGH (ref 0–99)
NonHDL: 187.44
Triglycerides: 92 mg/dL (ref 0.0–149.0)
VLDL: 18.4 mg/dL (ref 0.0–40.0)

## 2016-11-23 LAB — COMPREHENSIVE METABOLIC PANEL
ALK PHOS: 63 U/L (ref 39–117)
ALT: 19 U/L (ref 0–35)
AST: 18 U/L (ref 0–37)
Albumin: 4.2 g/dL (ref 3.5–5.2)
BILIRUBIN TOTAL: 0.6 mg/dL (ref 0.2–1.2)
BUN: 16 mg/dL (ref 6–23)
CO2: 29 meq/L (ref 19–32)
Calcium: 10 mg/dL (ref 8.4–10.5)
Chloride: 95 mEq/L — ABNORMAL LOW (ref 96–112)
Creatinine, Ser: 0.85 mg/dL (ref 0.40–1.20)
GFR: 71.4 mL/min (ref >60.00)
GLUCOSE: 92 mg/dL (ref 70–99)
POTASSIUM: 3.3 meq/L — AB (ref 3.5–5.1)
SODIUM: 133 meq/L — AB (ref 135–145)
TOTAL PROTEIN: 7 g/dL (ref 6.0–8.3)

## 2016-11-27 ENCOUNTER — Other Ambulatory Visit: Payer: Self-pay | Admitting: Family Medicine

## 2016-11-27 DIAGNOSIS — E785 Hyperlipidemia, unspecified: Secondary | ICD-10-CM

## 2016-11-28 ENCOUNTER — Telehealth: Payer: Self-pay | Admitting: Family Medicine

## 2016-11-28 NOTE — Telephone Encounter (Signed)
Pt called in for lab results.    Please call back at : [phone]

## 2016-12-05 ENCOUNTER — Other Ambulatory Visit: Payer: Self-pay | Admitting: Family Medicine

## 2016-12-05 DIAGNOSIS — I10 Essential (primary) hypertension: Secondary | ICD-10-CM

## 2016-12-05 NOTE — Telephone Encounter (Signed)
Was advised in March to F/U in 3 months/note sent to pharm to advise pt to sched appt first/thx dmf

## 2016-12-23 ENCOUNTER — Encounter: Payer: Self-pay | Admitting: Family Medicine

## 2016-12-23 DIAGNOSIS — I10 Essential (primary) hypertension: Secondary | ICD-10-CM

## 2016-12-25 MED ORDER — METOPROLOL SUCCINATE ER 100 MG PO TB24: ORAL_TABLET | ORAL | 0 refills | Status: DC

## 2016-12-25 MED ORDER — HYDROCHLOROTHIAZIDE 50 MG PO TABS: 50.0000 mg | ORAL_TABLET | Freq: Every day | ORAL | 0 refills | Status: DC

## 2017-02-23 ENCOUNTER — Other Ambulatory Visit (INDEPENDENT_AMBULATORY_CARE_PROVIDER_SITE_OTHER): Payer: BLUE CROSS/BLUE SHIELD

## 2017-02-23 DIAGNOSIS — E785 Hyperlipidemia, unspecified: Secondary | ICD-10-CM

## 2017-02-23 LAB — COMPREHENSIVE METABOLIC PANEL
ALBUMIN: 4.1 g/dL (ref 3.5–5.2)
ALT: 28 U/L (ref 0–35)
AST: 23 U/L (ref 0–37)
Alkaline Phosphatase: 62 U/L (ref 39–117)
BUN: 16 mg/dL (ref 6–23)
CHLORIDE: 98 meq/L (ref 96–112)
CO2: 32 mEq/L (ref 19–32)
CREATININE: 0.78 mg/dL (ref 0.40–1.20)
Calcium: 9.5 mg/dL (ref 8.4–10.5)
GFR: 78.78 mL/min (ref >60.00)
Glucose, Bld: 88 mg/dL (ref 70–99)
POTASSIUM: 3.9 meq/L (ref 3.5–5.1)
SODIUM: 136 meq/L (ref 135–145)
Total Bilirubin: 0.8 mg/dL (ref 0.2–1.2)
Total Protein: 6.9 g/dL (ref 6.0–8.3)

## 2017-02-23 LAB — LIPID PANEL
CHOL/HDL RATIO: 4
CHOLESTEROL: 248 mg/dL — AB (ref 0–200)
HDL: 55.2 mg/dL (ref >39.00)
LDL CALC: 172 mg/dL — AB (ref 0–99)
NonHDL: 192.64
Triglycerides: 105 mg/dL (ref 0.0–149.0)
VLDL: 21 mg/dL (ref 0.0–40.0)

## 2017-02-28 ENCOUNTER — Other Ambulatory Visit: Payer: Self-pay

## 2017-02-28 MED ORDER — ATORVASTATIN CALCIUM 10 MG PO TABS: 10.0000 mg | ORAL_TABLET | Freq: Every day | ORAL | 3 refills | Status: DC

## 2017-03-14 ENCOUNTER — Other Ambulatory Visit: Payer: Self-pay | Admitting: Family Medicine

## 2017-03-14 DIAGNOSIS — I10 Essential (primary) hypertension: Secondary | ICD-10-CM

## 2017-04-20 ENCOUNTER — Encounter: Payer: BLUE CROSS/BLUE SHIELD | Admitting: Family Medicine

## 2017-05-01 ENCOUNTER — Ambulatory Visit (INDEPENDENT_AMBULATORY_CARE_PROVIDER_SITE_OTHER): Payer: BLUE CROSS/BLUE SHIELD | Admitting: Family Medicine

## 2017-05-01 ENCOUNTER — Encounter: Payer: Self-pay | Admitting: Family Medicine

## 2017-05-01 VITALS — BP 138/90 | HR 79 | Temp 98.1°F | Resp 16 | Ht 62.0 in | Wt 160.0 lb

## 2017-05-01 DIAGNOSIS — R252 Cramp and spasm: Secondary | ICD-10-CM | POA: Diagnosis not present

## 2017-05-01 DIAGNOSIS — I10 Essential (primary) hypertension: Secondary | ICD-10-CM

## 2017-05-01 DIAGNOSIS — Z86010 Personal history of colonic polyps: Secondary | ICD-10-CM

## 2017-05-01 DIAGNOSIS — E785 Hyperlipidemia, unspecified: Secondary | ICD-10-CM | POA: Diagnosis not present

## 2017-05-01 DIAGNOSIS — Z0000 Encounter for general adult medical examination without abnormal findings: Secondary | ICD-10-CM

## 2017-05-01 DIAGNOSIS — M8580 Other specified disorders of bone density and structure, unspecified site: Secondary | ICD-10-CM

## 2017-05-01 MED ORDER — METOPROLOL SUCCINATE ER 100 MG PO TB24: ORAL_TABLET | ORAL | 3 refills | Status: DC

## 2017-05-01 MED ORDER — HYDROCHLOROTHIAZIDE 50 MG PO TABS: 50.0000 mg | ORAL_TABLET | Freq: Every day | ORAL | 3 refills | Status: DC

## 2017-05-01 NOTE — Patient Instructions (Signed)
Preventive Care 65 Years and Older, Female Preventive care refers to lifestyle choices and visits with your health care provider that can promote health and wellness. What does preventive care include?  A yearly physical exam. This is also called an annual well check.  Dental exams once or twice a year.  Routine eye exams. Ask your health care provider how often you should have your eyes checked.  Personal lifestyle choices, including: ? Daily care of your teeth and gums. ? Regular physical activity. ? Eating a healthy diet. ? Avoiding tobacco and drug use. ? Limiting alcohol use. ? Practicing safe sex. ? Taking low-dose aspirin every day. ? Taking vitamin and mineral supplements as recommended by your health care provider. What happens during an annual well check? The services and screenings done by your health care provider during your annual well check will depend on your age, overall health, lifestyle risk factors, and family history of disease. Counseling Your health care provider may ask you questions about your:  Alcohol use.  Tobacco use.  Drug use.  Emotional well-being.  Home and relationship well-being.  Sexual activity.  Eating habits.  History of falls.  Memory and ability to understand (cognition).  Work and work environment.  Reproductive health.  Screening You may have the following tests or measurements:  Height, weight, and BMI.  Blood pressure.  Lipid and cholesterol levels. These may be checked every 5 years, or more frequently if you are over 50 years old.  Skin check.  Lung cancer screening. You may have this screening every year starting at age 55 if you have a 30-pack-year history of smoking and currently smoke or have quit within the past 15 years.  Fecal occult blood test (FOBT) of the stool. You may have this test every year starting at age 50.  Flexible sigmoidoscopy or colonoscopy. You may have a sigmoidoscopy every 5 years or  a colonoscopy every 10 years starting at age 50.  Hepatitis C blood test.  Hepatitis B blood test.  Sexually transmitted disease (STD) testing.  Diabetes screening. This is done by checking your blood sugar (glucose) after you have not eaten for a while (fasting). You may have this done every 1-3 years.  Bone density scan. This is done to screen for osteoporosis. You may have this done starting at age 65.  Mammogram. This may be done every 1-2 years. Talk to your health care provider about how often you should have regular mammograms.  Talk with your health care provider about your test results, treatment options, and if necessary, the need for more tests. Vaccines Your health care provider may recommend certain vaccines, such as:  Influenza vaccine. This is recommended every year.  Tetanus, diphtheria, and acellular pertussis (Tdap, Td) vaccine. You may need a Td booster every 10 years.  Varicella vaccine. You may need this if you have not been vaccinated.  Zoster vaccine. You may need this after age 60.  Measles, mumps, and rubella (MMR) vaccine. You may need at least one dose of MMR if you were born in 1957 or later. You may also need a second dose.  Pneumococcal 13-valent conjugate (PCV13) vaccine. One dose is recommended after age 65.  Pneumococcal polysaccharide (PPSV23) vaccine. One dose is recommended after age 65.  Meningococcal vaccine. You may need this if you have certain conditions.  Hepatitis A vaccine. You may need this if you have certain conditions or if you travel or work in places where you may be exposed to hepatitis   A.  Hepatitis B vaccine. You may need this if you have certain conditions or if you travel or work in places where you may be exposed to hepatitis B.  Haemophilus influenzae type b (Hib) vaccine. You may need this if you have certain conditions.  Talk to your health care provider about which screenings and vaccines you need and how often you  need them. This information is not intended to replace advice given to you by your health care provider. Make sure you discuss any questions you have with your health care provider. Document Released: 03/19/2015 Document Revised: 11/10/2015 Document Reviewed: 12/22/2014 Elsevier Interactive Patient Education  2018 Elsevier Inc.  

## 2017-05-01 NOTE — Assessment & Plan Note (Signed)
Tolerating statin, encouraged heart healthy diet, avoid trans fats, minimize simple carbs and saturated fats. Increase exercise as tolerated 

## 2017-05-01 NOTE — Assessment & Plan Note (Addendum)
Poorly controlled will alter medications, encouraged DASH diet, minimize caffeine and obtain adequate sleep. Report concerning symptoms and follow up as directed and as needed--- bp rechecked----still high Add lisinopril 10 mg #30 1 po qd, 2 refills

## 2017-05-01 NOTE — Progress Notes (Addendum)
Subjective:     Amy Zimmerman is a 67 y.o. female and is here for a comprehensive physical exam. The patient reports problems - leg cramps at night since Jan-- she never starting taking the lipitor.  Social History   Socioeconomic History  . Marital status: Married    Spouse name: Not on file  . Number of children: Not on file  . Years of education: Not on file  . Highest education level: Not on file  Social Needs  . Financial resource strain: Not on file  . Food insecurity - worry: Not on file  . Food insecurity - inability: Not on file  . Transportation needs - medical: Not on file  . Transportation needs - non-medical: Not on file  Occupational History  . Occupation: Barrister's clerk  Tobacco Use  . Smoking status: Never Smoker  . Smokeless tobacco: Never Used  Substance and Sexual Activity  . Alcohol use: No  . Drug use: No  . Sexual activity: Yes    Partners: Male  Other Topics Concern  . Not on file  Social History Narrative   Exercise- no   Health Maintenance  Topic Date Due  . DEXA SCAN  08/13/2015  . COLONOSCOPY  04/16/2016  . MAMMOGRAM  10/17/2016  . HIV Screening  05/02/2023 (Originally 02/28/1966)  . PNA vac Low Risk Adult (1 of 2 - PCV13) 05/02/2023 (Originally 02/27/2017)  . INFLUENZA VACCINE  01/02/2024 (Originally 10/04/2016)  . PAP SMEAR  08/23/2017  . TETANUS/TDAP  03/08/2021  . Hepatitis C Screening  Completed    The following portions of the patient's history were reviewed and updated as appropriate:  She  has a past medical history of Hypertension. She does not have any pertinent problems on file. She  has a past surgical history that includes uterine polyps and Colonoscopy. Her family history includes Heart disease in her mother; Heart disease (age of onset: 53) in her father; Hyperlipidemia in her sister and sister; Hypertension in her mother; Kidney disease in her mother. She  reports that  has never smoked. she has never used  smokeless tobacco. She reports that she does not drink alcohol or use drugs. She has a current medication list which includes the following prescription(s): calcium carbonate, hydrochlorothiazide, metoprolol succinate, and omega-3 fish oil. Current Outpatient Medications on File Prior to Visit  Medication Sig Dispense Refill  . calcium carbonate (OS-CAL) 600 MG TABS Take 600 mg by mouth 2 (two) times daily with a meal.      . Omega-3 Fatty Acids (OMEGA-3 FISH OIL) 500 MG CAPS Take 1 capsule by mouth 2 (two) times daily.     No current facility-administered medications on file prior to visit.    She has No Known Allergies..  Review of Systems Review of Systems  Constitutional: Negative for activity change, appetite change and fatigue.  HENT: Negative for hearing loss, congestion, tinnitus and ear discharge.  dentist q51m Eyes: Negative for visual disturbance (see optho q1y -- vision corrected to 20/20 with glasses).  Respiratory: Negative for cough, chest tightness and shortness of breath.   Cardiovascular: Negative for chest pain, palpitations and leg swelling.  Gastrointestinal: Negative for abdominal pain, diarrhea, constipation and abdominal distention.  Genitourinary: Negative for urgency, frequency, decreased urine volume and difficulty urinating.  Musculoskeletal: Negative for back pain, arthralgias and gait problem.  Skin: Negative for color change, pallor and rash.  Neurological: Negative for dizziness, light-headedness, numbness and headaches.  Hematological: Negative for adenopathy. Does not bruise/bleed  easily.  Psychiatric/Behavioral: Negative for suicidal ideas, confusion, sleep disturbance, self-injury, dysphoric mood, decreased concentration and agitation.       Objective:    BP 138/90   Pulse 79   Temp 98.1 F (36.7 C) (Oral)   Resp 16   Ht 5\' 2"  (1.575 m)   Wt 160 lb (72.6 kg)   SpO2 97%   BMI 29.26 kg/m  General appearance: alert, cooperative, appears stated  age and no distress Head: Normocephalic, without obvious abnormality, atraumatic Eyes: negative findings: lids and lashes normal, conjunctivae and sclerae normal and pupils equal, round, reactive to light and accomodation Ears: normal TM's and external ear canals both ears Nose: Nares normal. Septum midline. Mucosa normal. No drainage or sinus tenderness. Throat: lips, mucosa, and tongue normal; teeth and gums normal Neck: no adenopathy, no carotid bruit, no JVD, supple, symmetrical, trachea midline and thyroid not enlarged, symmetric, no tenderness/mass/nodules Back: symmetric, no curvature. ROM normal. No CVA tenderness. Lungs: clear to auscultation bilaterally Breasts: normal appearance, no masses or tenderness Heart: regular rate and rhythm, S1, S2 normal, no murmur, click, rub or gallop Abdomen: soft, non-tender; bowel sounds normal; no masses,  no organomegaly Pelvic: not indicated; post-menopausal, no abnormal Pap smears in past Extremities: extremities normal, atraumatic, no cyanosis or edema Pulses: 2+ and symmetric Skin: Skin color, texture, turgor normal. No rashes or lesions Lymph nodes: Cervical, supraclavicular, and axillary nodes normal. Neurologic: Alert and oriented X 3, normal strength and tone. Normal symmetric reflexes. Normal coordination and gait    Assessment:    Healthy female exam.      Plan:    ghm utd Check labs See After Visit Summary for Counseling Recommendations   1. Osteopenia, unspecified location  - DG Bone Density; Future  2. Hyperlipidemia LDL goal <100 Encouraged heart healthy diet, increase exercise, avoid trans fats, consider a krill oil cap daily - Lipid panel; Future - Comprehensive metabolic panel; Future  3. Preventative health care See above - TSH; Future - Lipid panel; Future - Comprehensive metabolic panel; Future - CBC with Differential/Platelet; Future - Ambulatory referral to Gastroenterology  4. Leg cramps hylands for leg  cramps Check labs  Try bananas or oj, tonic water - Comprehensive metabolic panel; Future - CBC with Differential/Platelet; Future - Phosphorus; Future - Magnesium; Future  5. Essential hypertension Well controlled, no changes to meds. Encouraged heart healthy diet such as the DASH diet and exercise as tolerated.  - metoprolol succinate (TOPROL-XL) 100 MG 24 hr tablet; TAKE 1 TABLET ONCE DAILY   WITH OR IMMEDIATELY        FOLLOWING A MEAL  Dispense: 90 tablet; Refill: 3 - hydrochlorothiazide (HYDRODIURIL) 50 MG tablet; Take 1 tablet (50 mg total) by mouth daily.  Dispense: 90 tablet; Refill: 3  6. History of colon polyps  - Ambulatory referral to Gastroenterology

## 2017-05-02 ENCOUNTER — Telehealth: Payer: Self-pay | Admitting: *Deleted

## 2017-05-02 MED ORDER — LISINOPRIL 10 MG PO TABS: 10.0000 mg | ORAL_TABLET | Freq: Every day | ORAL | 2 refills | Status: DC

## 2017-05-02 NOTE — Telephone Encounter (Signed)
Left message on machine that Dr. Etter Sjogren wants to add lisinopril to her regaman of blood pressure medications to take daily.  rx sent in.

## 2017-05-09 ENCOUNTER — Other Ambulatory Visit (INDEPENDENT_AMBULATORY_CARE_PROVIDER_SITE_OTHER): Payer: BLUE CROSS/BLUE SHIELD

## 2017-05-09 DIAGNOSIS — Z0000 Encounter for general adult medical examination without abnormal findings: Secondary | ICD-10-CM

## 2017-05-09 DIAGNOSIS — E785 Hyperlipidemia, unspecified: Secondary | ICD-10-CM

## 2017-05-09 DIAGNOSIS — R252 Cramp and spasm: Secondary | ICD-10-CM

## 2017-05-09 LAB — COMPREHENSIVE METABOLIC PANEL
ALBUMIN: 4 g/dL (ref 3.5–5.2)
ALT: 24 U/L (ref 0–35)
AST: 18 U/L (ref 0–37)
Alkaline Phosphatase: 55 U/L (ref 39–117)
BUN: 12 mg/dL (ref 6–23)
CALCIUM: 10.3 mg/dL (ref 8.4–10.5)
CO2: 32 mEq/L (ref 19–32)
Chloride: 96 mEq/L (ref 96–112)
Creatinine, Ser: 0.79 mg/dL (ref 0.40–1.20)
GFR: 77.58 mL/min (ref >60.00)
Glucose, Bld: 88 mg/dL (ref 70–99)
POTASSIUM: 3.7 meq/L (ref 3.5–5.1)
Sodium: 136 mEq/L (ref 135–145)
TOTAL PROTEIN: 6.9 g/dL (ref 6.0–8.3)
Total Bilirubin: 0.6 mg/dL (ref 0.2–1.2)

## 2017-05-09 LAB — LIPID PANEL
CHOLESTEROL: 240 mg/dL — AB (ref 0–200)
HDL: 53.8 mg/dL (ref >39.00)
LDL Cholesterol: 152 mg/dL — ABNORMAL HIGH (ref 0–99)
NonHDL: 186.19
TRIGLYCERIDES: 173 mg/dL — AB (ref 0.0–149.0)
Total CHOL/HDL Ratio: 4
VLDL: 34.6 mg/dL (ref 0.0–40.0)

## 2017-05-09 LAB — CBC WITH DIFFERENTIAL/PLATELET
BASOS PCT: 0.6 % (ref 0.0–3.0)
Basophils Absolute: 0 10*3/uL (ref 0.0–0.1)
EOS PCT: 3.3 % (ref 0.0–5.0)
Eosinophils Absolute: 0.2 10*3/uL (ref 0.0–0.7)
HEMATOCRIT: 41.4 % (ref 36.0–46.0)
Hemoglobin: 14.1 g/dL (ref 12.0–15.0)
Lymphocytes Relative: 45.3 % (ref 12.0–46.0)
Lymphs Abs: 2.9 10*3/uL (ref 0.7–4.0)
MCHC: 34 g/dL (ref 30.0–36.0)
MCV: 86.2 fl (ref 78.0–100.0)
MONOS PCT: 6 % (ref 3.0–12.0)
Monocytes Absolute: 0.4 10*3/uL (ref 0.1–1.0)
Neutro Abs: 2.9 10*3/uL (ref 1.4–7.7)
Neutrophils Relative %: 44.8 % (ref 43.0–77.0)
Platelets: 246 10*3/uL (ref 150.0–400.0)
RBC: 4.8 Mil/uL (ref 3.87–5.11)
RDW: 13.1 % (ref 11.5–15.5)
WBC: 6.5 10*3/uL (ref 4.0–10.5)

## 2017-05-09 LAB — MAGNESIUM: Magnesium: 1.5 mg/dL (ref 1.5–2.5)

## 2017-05-09 LAB — TSH: TSH: 3.82 u[IU]/mL (ref 0.35–4.50)

## 2017-05-09 LAB — PHOSPHORUS: PHOSPHORUS: 3.8 mg/dL (ref 2.3–4.6)

## 2017-05-14 ENCOUNTER — Telehealth: Payer: Self-pay | Admitting: *Deleted

## 2017-05-14 NOTE — Telephone Encounter (Signed)
Called patient with results.  She stated that she started on the 3rd blood pressure medication lisinopril.  She states that she sees no changes in her blood pressure and that she will not be getting medication refilled.

## 2017-05-14 NOTE — Telephone Encounter (Signed)
Refer to cardiology 

## 2017-05-15 NOTE — Telephone Encounter (Signed)
Patient does not want to see cardiologist at this time.  She will keep record of bps at home and bring them in.  She will make an appt to come in for a nurse visit for bp check.

## 2017-06-03 ENCOUNTER — Other Ambulatory Visit: Payer: Self-pay | Admitting: Family Medicine

## 2017-06-03 DIAGNOSIS — I10 Essential (primary) hypertension: Secondary | ICD-10-CM

## 2017-09-10 ENCOUNTER — Encounter: Payer: Self-pay | Admitting: Family Medicine

## 2017-10-30 ENCOUNTER — Encounter: Payer: Self-pay | Admitting: Family Medicine

## 2017-10-30 ENCOUNTER — Ambulatory Visit: Payer: BLUE CROSS/BLUE SHIELD | Admitting: Family Medicine

## 2017-10-30 VITALS — BP 155/73 | HR 62 | Temp 98.2°F | Resp 16 | Ht 62.0 in | Wt 159.8 lb

## 2017-10-30 DIAGNOSIS — E785 Hyperlipidemia, unspecified: Secondary | ICD-10-CM | POA: Diagnosis not present

## 2017-10-30 DIAGNOSIS — I10 Essential (primary) hypertension: Secondary | ICD-10-CM

## 2017-10-30 LAB — COMPREHENSIVE METABOLIC PANEL
ALBUMIN: 4.5 g/dL (ref 3.5–5.2)
ALK PHOS: 67 U/L (ref 39–117)
ALT: 22 U/L (ref 0–35)
AST: 18 U/L (ref 0–37)
BILIRUBIN TOTAL: 1 mg/dL (ref 0.2–1.2)
BUN: 13 mg/dL (ref 6–23)
CALCIUM: 10.5 mg/dL (ref 8.4–10.5)
CO2: 33 mEq/L — ABNORMAL HIGH (ref 19–32)
Chloride: 93 mEq/L — ABNORMAL LOW (ref 96–112)
Creatinine, Ser: 0.85 mg/dL (ref 0.40–1.20)
GFR: 71.19 mL/min (ref >60.00)
GLUCOSE: 101 mg/dL — AB (ref 70–99)
POTASSIUM: 4 meq/L (ref 3.5–5.1)
Sodium: 134 mEq/L — ABNORMAL LOW (ref 135–145)
TOTAL PROTEIN: 7.4 g/dL (ref 6.0–8.3)

## 2017-10-30 LAB — LIPID PANEL
CHOLESTEROL: 271 mg/dL — AB (ref 0–200)
HDL: 51.5 mg/dL (ref >39.00)
LDL Cholesterol: 187 mg/dL — ABNORMAL HIGH (ref 0–99)
NonHDL: 219.71
Total CHOL/HDL Ratio: 5
Triglycerides: 162 mg/dL — ABNORMAL HIGH (ref 0.0–149.0)
VLDL: 32.4 mg/dL (ref 0.0–40.0)

## 2017-10-30 MED ORDER — HYDROCHLOROTHIAZIDE 50 MG PO TABS: 50.0000 mg | ORAL_TABLET | Freq: Every day | ORAL | 1 refills | Status: DC

## 2017-10-30 MED ORDER — METOPROLOL SUCCINATE ER 100 MG PO TB24: ORAL_TABLET | ORAL | 1 refills | Status: DC

## 2017-10-30 NOTE — Patient Instructions (Signed)
DASH Eating Plan DASH stands for "Dietary Approaches to Stop Hypertension." The DASH eating plan is a healthy eating plan that has been shown to reduce high blood pressure (hypertension). It may also reduce your risk for type 2 diabetes, heart disease, and stroke. The DASH eating plan may also help with weight loss. What are tips for following this plan? General guidelines  Avoid eating more than 2,300 mg (milligrams) of salt (sodium) a day. If you have hypertension, you may need to reduce your sodium intake to 1,500 mg a day.  Limit alcohol intake to no more than 1 drink a day for nonpregnant women and 2 drinks a day for men. One drink equals 12 oz of beer, 5 oz of wine, or 1 oz of hard liquor.  Work with your health care provider to maintain a healthy body weight or to lose weight. Ask what an ideal weight is for you.  Get at least 30 minutes of exercise that causes your heart to beat faster (aerobic exercise) most days of the week. Activities may include walking, swimming, or biking.  Work with your health care provider or diet and nutrition specialist (dietitian) to adjust your eating plan to your individual calorie needs. Reading food labels  Check food labels for the amount of sodium per serving. Choose foods with less than 5 percent of the Daily Value of sodium. Generally, foods with less than 300 mg of sodium per serving fit into this eating plan.  To find whole grains, look for the word "whole" as the first word in the ingredient list. Shopping  Buy products labeled as "low-sodium" or "no salt added."  Buy fresh foods. Avoid canned foods and premade or frozen meals. Cooking  Avoid adding salt when cooking. Use salt-free seasonings or herbs instead of table salt or sea salt. Check with your health care provider or pharmacist before using salt substitutes.  Do not fry foods. Cook foods using healthy methods such as baking, boiling, grilling, and broiling instead.  Cook with  heart-healthy oils, such as olive, canola, soybean, or sunflower oil. Meal planning   Eat a balanced diet that includes: ? 5 or more servings of fruits and vegetables each day. At each meal, try to fill half of your plate with fruits and vegetables. ? Up to 6-8 servings of whole grains each day. ? Less than 6 oz of lean meat, poultry, or fish each day. A 3-oz serving of meat is about the same size as a deck of cards. One egg equals 1 oz. ? 2 servings of low-fat dairy each day. ? A serving of nuts, seeds, or beans 5 times each week. ? Heart-healthy fats. Healthy fats called Omega-3 fatty acids are found in foods such as flaxseeds and coldwater fish, like sardines, salmon, and mackerel.  Limit how much you eat of the following: ? Canned or prepackaged foods. ? Food that is high in trans fat, such as fried foods. ? Food that is high in saturated fat, such as fatty meat. ? Sweets, desserts, sugary drinks, and other foods with added sugar. ? Full-fat dairy products.  Do not salt foods before eating.  Try to eat at least 2 vegetarian meals each week.  Eat more home-cooked food and less restaurant, buffet, and fast food.  When eating at a restaurant, ask that your food be prepared with less salt or no salt, if possible. What foods are recommended? The items listed may not be a complete list. Talk with your dietitian about what   dietary choices are best for you. Grains Whole-grain or whole-wheat bread. Whole-grain or whole-wheat pasta. Brown rice. Oatmeal. Quinoa. Bulgur. Whole-grain and low-sodium cereals. Pita bread. Low-fat, low-sodium crackers. Whole-wheat flour tortillas. Vegetables Fresh or frozen vegetables (raw, steamed, roasted, or grilled). Low-sodium or reduced-sodium tomato and vegetable juice. Low-sodium or reduced-sodium tomato sauce and tomato paste. Low-sodium or reduced-sodium canned vegetables. Fruits All fresh, dried, or frozen fruit. Canned fruit in natural juice (without  added sugar). Meat and other protein foods Skinless chicken or turkey. Ground chicken or turkey. Pork with fat trimmed off. Fish and seafood. Egg whites. Dried beans, peas, or lentils. Unsalted nuts, nut butters, and seeds. Unsalted canned beans. Lean cuts of beef with fat trimmed off. Low-sodium, lean deli meat. Dairy Low-fat (1%) or fat-free (skim) milk. Fat-free, low-fat, or reduced-fat cheeses. Nonfat, low-sodium ricotta or cottage cheese. Low-fat or nonfat yogurt. Low-fat, low-sodium cheese. Fats and oils Soft margarine without trans fats. Vegetable oil. Low-fat, reduced-fat, or light mayonnaise and salad dressings (reduced-sodium). Canola, safflower, olive, soybean, and sunflower oils. Avocado. Seasoning and other foods Herbs. Spices. Seasoning mixes without salt. Unsalted popcorn and pretzels. Fat-free sweets. What foods are not recommended? The items listed may not be a complete list. Talk with your dietitian about what dietary choices are best for you. Grains Baked goods made with fat, such as croissants, muffins, or some breads. Dry pasta or rice meal packs. Vegetables Creamed or fried vegetables. Vegetables in a cheese sauce. Regular canned vegetables (not low-sodium or reduced-sodium). Regular canned tomato sauce and paste (not low-sodium or reduced-sodium). Regular tomato and vegetable juice (not low-sodium or reduced-sodium). Pickles. Olives. Fruits Canned fruit in a light or heavy syrup. Fried fruit. Fruit in cream or butter sauce. Meat and other protein foods Fatty cuts of meat. Ribs. Fried meat. Bacon. Sausage. Bologna and other processed lunch meats. Salami. Fatback. Hotdogs. Bratwurst. Salted nuts and seeds. Canned beans with added salt. Canned or smoked fish. Whole eggs or egg yolks. Chicken or turkey with skin. Dairy Whole or 2% milk, cream, and half-and-half. Whole or full-fat cream cheese. Whole-fat or sweetened yogurt. Full-fat cheese. Nondairy creamers. Whipped toppings.  Processed cheese and cheese spreads. Fats and oils Butter. Stick margarine. Lard. Shortening. Ghee. Bacon fat. Tropical oils, such as coconut, palm kernel, or palm oil. Seasoning and other foods Salted popcorn and pretzels. Onion salt, garlic salt, seasoned salt, table salt, and sea salt. Worcestershire sauce. Tartar sauce. Barbecue sauce. Teriyaki sauce. Soy sauce, including reduced-sodium. Steak sauce. Canned and packaged gravies. Fish sauce. Oyster sauce. Cocktail sauce. Horseradish that you find on the shelf. Ketchup. Mustard. Meat flavorings and tenderizers. Bouillon cubes. Hot sauce and Tabasco sauce. Premade or packaged marinades. Premade or packaged taco seasonings. Relishes. Regular salad dressings. Where to find more information:  National Heart, Lung, and Blood Institute: www.nhlbi.nih.gov  American Heart Association: www.heart.org Summary  The DASH eating plan is a healthy eating plan that has been shown to reduce high blood pressure (hypertension). It may also reduce your risk for type 2 diabetes, heart disease, and stroke.  With the DASH eating plan, you should limit salt (sodium) intake to 2,300 mg a day. If you have hypertension, you may need to reduce your sodium intake to 1,500 mg a day.  When on the DASH eating plan, aim to eat more fresh fruits and vegetables, whole grains, lean proteins, low-fat dairy, and heart-healthy fats.  Work with your health care provider or diet and nutrition specialist (dietitian) to adjust your eating plan to your individual   calorie needs. This information is not intended to replace advice given to you by your health care provider. Make sure you discuss any questions you have with your health care provider. Document Released: 02/09/2011 Document Revised: 02/14/2016 Document Reviewed: 02/14/2016 Elsevier Interactive Patient Education  2018 Elsevier Inc.  

## 2017-10-30 NOTE — Assessment & Plan Note (Signed)
Encouraged heart healthy diet, increase exercise, avoid trans fats, consider a krill oil cap daily 

## 2017-10-30 NOTE — Assessment & Plan Note (Signed)
Well controlled, no changes to meds. Encouraged heart healthy diet such as the DASH diet and exercise as tolerated. ---Medley coat syndrome

## 2017-10-30 NOTE — Progress Notes (Signed)
Patient ID: Amy Zimmerman, female    DOB: 03-01-1951  Age: 67 y.o. MRN: [id number]    Subjective:  Subjective  HPI VIRGENE TIRONE presents for f/u bp and cholesterol   No complaints   Her bp at home running 116-120/70-80   Pt did not take lisinopril because of that.     Review of Systems  Constitutional: Negative for activity change, appetite change, chills, diaphoresis, fatigue, fever and unexpected weight change.  Eyes: Negative for pain, redness and visual disturbance.  Respiratory: Negative for cough, chest tightness, shortness of breath and wheezing.   Cardiovascular: Negative for chest pain, palpitations and leg swelling.  Gastrointestinal: Negative for abdominal distention and abdominal pain.  Endocrine: Negative for cold intolerance, heat intolerance, polydipsia, polyphagia and polyuria.  Genitourinary: Negative for difficulty urinating, dyspareunia, dysuria, flank pain, frequency, genital sores, hematuria, menstrual problem, pelvic pain, urgency, vaginal discharge and vaginal pain.  Musculoskeletal: Negative for back pain.  Neurological: Negative for dizziness, light-headedness, numbness and headaches.    History Past Medical History:  Diagnosis Date  . Hypertension     She has a past surgical history that includes uterine polyps and Colonoscopy.   Her family history includes Heart disease in her mother; Heart disease (age of onset: 62) in her father; Hyperlipidemia in her sister and sister; Hypertension in her mother; Kidney disease in her mother.She reports that she has never smoked. She has never used smokeless tobacco. She reports that she does not drink alcohol or use drugs.  Current Outpatient Medications on File Prior to Visit  Medication Sig Dispense Refill  . calcium carbonate (OS-CAL) 600 MG TABS Take 600 mg by mouth 2 (two) times daily with a meal.      . Omega-3 Fatty Acids (OMEGA-3 FISH OIL) 500 MG CAPS Take 1 capsule by mouth 2 (two) times daily.     No  current facility-administered medications on file prior to visit.      Objective:  Objective  Physical Exam  Constitutional: She is oriented to person, place, and time. She appears well-developed and well-nourished.  HENT:  Head: Normocephalic and atraumatic.  Eyes: Conjunctivae and EOM are normal.  Neck: Normal range of motion. Neck supple. No JVD present. Carotid bruit is not present. No thyromegaly present.  Cardiovascular: Normal rate, regular rhythm and normal heart sounds.  No murmur heard. Pulmonary/Chest: Effort normal and breath sounds normal. No respiratory distress. She has no wheezes. She has no rales. She exhibits no tenderness.  Musculoskeletal: She exhibits no edema.  Neurological: She is alert and oriented to person, place, and time.  Psychiatric: She has a normal mood and affect.  Nursing note and vitals reviewed.  BP (!) 155/73   Pulse 62   Temp 98.2 F (36.8 C) (Oral)   Resp 16   Ht 5\' 2"  (1.575 m)   Wt 159 lb 12.8 oz (72.5 kg)   SpO2 97%   BMI 29.23 kg/m  Wt Readings from Last 3 Encounters:  10/30/17 159 lb 12.8 oz (72.5 kg)  05/01/17 160 lb (72.6 kg)  05/19/16 155 lb 9.6 oz (70.6 kg)     Lab Results  Component Value Date   WBC 6.5 05/09/2017   HGB 14.1 05/09/2017   HCT 41.4 05/09/2017   PLT 246.0 05/09/2017   GLUCOSE 88 05/09/2017   CHOL 240 (H) 05/09/2017   TRIG 173.0 (H) 05/09/2017   HDL 53.80 05/09/2017   LDLDIRECT 159.9 11/21/2012   LDLCALC 152 (H) 05/09/2017   ALT 24 05/09/2017  AST 18 05/09/2017   NA 136 05/09/2017   K 3.7 05/09/2017   CL 96 05/09/2017   CREATININE 0.79 05/09/2017   BUN 12 05/09/2017   CO2 32 05/09/2017   TSH 3.82 05/09/2017   HGBA1C 5.5 02/18/2007    Nm Thyroid Sng Uptake W/imaging  Result Date: 10/29/2015 CLINICAL DATA:  Evaluate for sub clinical thyrotoxicosis. TSH equal 0.02 EXAM: THYROID SCAN AND UPTAKE - 24 HOURS TECHNIQUE: Following the per oral administration of I-131 sodium iodide, the patient returned  at 24 hours and uptake measurements were acquired with the uptake probe centered on the neck. Thyroid imaging was performed following the intravenous administration of the Tc-55m Pertechnetate. RADIOPHARMACEUTICALS:  7.9 MicroCuries I-131 sodium iodide orally and 10.1 mCi Technetium-64m pertechnetate IV COMPARISON:  None FINDINGS: There is mild heterogeneity of uptake within the thyroid gland which is mildly enlarged. 24 hour I 131 uptake = 16% (normal 10-30%) IMPRESSION: Heterogeneity uptake within mildly enlarged gland with suppressed TSH and normal uptake would be consistent with subacute thyroiditis or toxic multinodular goiter. Electronically Signed   By: Suzy Bouchard M.D.   On: 10/29/2015 15:03     Assessment & Plan:  Plan  I have discontinued Olanda Boughner. Bronkema's metoprolol succinate, hydrochlorothiazide, and lisinopril. I have also changed her metoprolol succinate and hydrochlorothiazide. Additionally, I am having her maintain her calcium carbonate and Omega-3 Fish Oil.  Meds ordered this encounter  Medications  . metoprolol succinate (TOPROL-XL) 100 MG 24 hr tablet    Sig: Take with or immediately following a meal.    Dispense:  90 tablet    Refill:  1  . hydrochlorothiazide (HYDRODIURIL) 50 MG tablet    Sig: Take 1 tablet (50 mg total) by mouth daily.    Dispense:  90 tablet    Refill:  1    Problem List Items Addressed This Visit      Unprioritized   Essential hypertension    Well controlled, no changes to meds. Encouraged heart healthy diet such as the DASH diet and exercise as tolerated. ---Crowl coat syndrome      Relevant Medications   metoprolol succinate (TOPROL-XL) 100 MG 24 hr tablet   hydrochlorothiazide (HYDRODIURIL) 50 MG tablet   Hyperlipidemia LDL goal <100    Encouraged heart healthy diet, increase exercise, avoid trans fats, consider a krill oil cap daily      Relevant Medications   metoprolol succinate (TOPROL-XL) 100 MG 24 hr tablet    hydrochlorothiazide (HYDRODIURIL) 50 MG tablet    Other Visit Diagnoses    Hyperlipidemia, unspecified hyperlipidemia type    -  Primary   Relevant Medications   metoprolol succinate (TOPROL-XL) 100 MG 24 hr tablet   hydrochlorothiazide (HYDRODIURIL) 50 MG tablet   Other Relevant Orders   Comprehensive metabolic panel   Lipid panel      Follow-up: Return in about 6 months (around 05/02/2018) for annual exam, fasting.  Ann Held, DO

## 2017-12-09 ENCOUNTER — Other Ambulatory Visit: Payer: Self-pay | Admitting: Family Medicine

## 2017-12-09 DIAGNOSIS — I10 Essential (primary) hypertension: Secondary | ICD-10-CM

## 2018-03-09 ENCOUNTER — Other Ambulatory Visit: Payer: Self-pay | Admitting: Family Medicine

## 2018-03-09 DIAGNOSIS — I10 Essential (primary) hypertension: Secondary | ICD-10-CM

## 2018-04-16 DIAGNOSIS — D485 Neoplasm of uncertain behavior of skin: Secondary | ICD-10-CM | POA: Diagnosis not present

## 2018-04-16 DIAGNOSIS — L814 Other melanin hyperpigmentation: Secondary | ICD-10-CM | POA: Diagnosis not present

## 2018-04-16 DIAGNOSIS — D0339 Melanoma in situ of other parts of face: Secondary | ICD-10-CM | POA: Diagnosis not present

## 2018-04-16 DIAGNOSIS — D0359 Melanoma in situ of other part of trunk: Secondary | ICD-10-CM | POA: Diagnosis not present

## 2018-04-16 DIAGNOSIS — Z85828 Personal history of other malignant neoplasm of skin: Secondary | ICD-10-CM | POA: Diagnosis not present

## 2018-04-16 DIAGNOSIS — L821 Other seborrheic keratosis: Secondary | ICD-10-CM | POA: Diagnosis not present

## 2018-04-22 ENCOUNTER — Encounter: Payer: Self-pay | Admitting: Family Medicine

## 2018-04-22 DIAGNOSIS — C439 Malignant melanoma of skin, unspecified: Secondary | ICD-10-CM | POA: Insufficient documentation

## 2018-04-30 DIAGNOSIS — D0359 Melanoma in situ of other part of trunk: Secondary | ICD-10-CM | POA: Diagnosis not present

## 2018-05-04 ENCOUNTER — Other Ambulatory Visit: Payer: Self-pay | Admitting: Family Medicine

## 2018-05-04 DIAGNOSIS — I10 Essential (primary) hypertension: Secondary | ICD-10-CM

## 2018-05-06 ENCOUNTER — Encounter: Payer: Self-pay | Admitting: Medical

## 2018-05-06 ENCOUNTER — Ambulatory Visit (INDEPENDENT_AMBULATORY_CARE_PROVIDER_SITE_OTHER): Payer: BLUE CROSS/BLUE SHIELD | Admitting: Medical

## 2018-05-06 VITALS — BP 141/74 | HR 89 | Temp 98.3°F | Resp 16 | Ht 62.0 in | Wt 160.4 lb

## 2018-05-06 DIAGNOSIS — J40 Bronchitis, not specified as acute or chronic: Secondary | ICD-10-CM

## 2018-05-06 DIAGNOSIS — R05 Cough: Secondary | ICD-10-CM | POA: Diagnosis not present

## 2018-05-06 DIAGNOSIS — R5383 Other fatigue: Secondary | ICD-10-CM

## 2018-05-06 DIAGNOSIS — R059 Cough, unspecified: Secondary | ICD-10-CM

## 2018-05-06 MED ORDER — BENZONATATE 100 MG PO CAPS: 100.0000 mg | ORAL_CAPSULE | Freq: Three times a day (TID) | ORAL | 0 refills | Status: DC | PRN

## 2018-05-06 MED ORDER — DOXYCYCLINE HYCLATE 100 MG PO TABS: 100.0000 mg | ORAL_TABLET | Freq: Two times a day (BID) | ORAL | 0 refills | Status: DC

## 2018-05-06 NOTE — Progress Notes (Signed)
Subjective:    Patient ID: Amy Zimmerman, female    DOB: August 15, 1950, 69 y.o.   MRN: [id number]  HPI  Pt in for recent illness for about one week of more. Felt more like uri symptoms but this weekend felt little feverish, fatigue, and mild shaky. Pt has been feeling stressed just recently as her husband just passed away suddenly  and unexpected. He may have had cardiac arrest/MI.(he had low grade fever and lost about 5 pounds before he passed).  Pt close contact to grandkids who had the flu about 2 weeks ago. Pt did not have flu vaccine this year.  Pt had no travel recently.   Review of Systems  Constitutional: Positive for fatigue. Negative for chills and fever.  HENT: Negative for congestion, postnasal drip, rhinorrhea, sinus pressure and sinus pain.   Respiratory: Positive for cough. Negative for chest tightness and shortness of breath.   Cardiovascular: Negative for chest pain and palpitations.  Gastrointestinal: Negative for abdominal pain.  Musculoskeletal: Negative for back pain, myalgias and neck pain.  Neurological: Negative for dizziness, syncope, weakness, light-headedness and headaches.       Mild shaky.  Hematological: Negative for adenopathy. Does not bruise/bleed easily.  Psychiatric/Behavioral: Negative for behavioral problems, confusion and dysphoric mood. The patient is not nervous/anxious.        Seems to handling death of husband well at this point. But death just recent.    Past Medical History:  Diagnosis Date  . Hypertension      Social History   Socioeconomic History  . Marital status: Married    Spouse name: Not on file  . Number of children: Not on file  . Years of education: Not on file  . Highest education level: Not on file  Occupational History  . Occupation: Barrister's clerk  Social Needs  . Financial resource strain: Not on file  . Food insecurity:    Worry: Not on file    Inability: Not on file  . Transportation needs:     Medical: Not on file    Non-medical: Not on file  Tobacco Use  . Smoking status: Never Smoker  . Smokeless tobacco: Never Used  Substance and Sexual Activity  . Alcohol use: No  . Drug use: No  . Sexual activity: Yes    Partners: Male  Lifestyle  . Physical activity:    Days per week: Not on file    Minutes per session: Not on file  . Stress: Not on file  Relationships  . Social connections:    Talks on phone: Not on file    Gets together: Not on file    Attends religious service: Not on file    Active member of club or organization: Not on file    Attends meetings of clubs or organizations: Not on file    Relationship status: Not on file  . Intimate partner violence:    Fear of current or ex partner: Not on file    Emotionally abused: Not on file    Physically abused: Not on file    Forced sexual activity: Not on file  Other Topics Concern  . Not on file  Social History Narrative   Exercise- no    Past Surgical History:  Procedure Laterality Date  . COLONOSCOPY    . uterine polyps      Family History  Problem Relation Age of Onset  . Heart disease Mother   . Kidney disease Mother   .  Hypertension Mother   . Heart disease Father 32       MI  . Hyperlipidemia Sister   . Hyperlipidemia Sister   . Colon cancer Neg Hx   . Stomach cancer Neg Hx     No Known Allergies  Current Outpatient Medications on File Prior to Visit  Medication Sig Dispense Refill  . calcium carbonate (OS-CAL) 600 MG TABS Take 600 mg by mouth 2 (two) times daily with a meal.      . hydrochlorothiazide (HYDRODIURIL) 50 MG tablet TAKE 1 TABLET DAILY 90 tablet 1  . metoprolol succinate (TOPROL-XL) 100 MG 24 hr tablet TAKE 1 TABLET BY MOUTH EVERY DAY TAKE WITH OR IMMEDIATELY FOLLOWING A MEAL.  Due for OV 90 tablet 0  . Omega-3 Fatty Acids (OMEGA-3 FISH OIL) 500 MG CAPS Take 1 capsule by mouth 2 (two) times daily.     No current facility-administered medications on file prior to visit.      BP (!) 141/74   Pulse 89   Temp 98.3 F (36.8 C) (Oral)   Resp 16   Ht 5\' 2"  (1.575 m)   Wt 160 lb 6.4 oz (72.8 kg)   SpO2 99%   BMI 29.34 kg/m       Objective:   Physical Exam   General  Mental Status - Alert. General Appearance - Well groomed. Not in acute distress.  Skin Rashes- No Rashes.  HEENT Head- Normal. Ear Auditory Canal - Left- Normal. Right - Normal.Tympanic Membrane- Left- Normal. Right- Normal. Eye Sclera/Conjunctiva- Left- Normal. Right- Normal. Nose & Sinuses Nasal Mucosa- Left-  Boggy and Congested. Right-  Boggy and  Congested.Bilateral maxillary and frontal sinus pressure. Mouth & Throat Lips: Upper Lip- Normal: no dryness, cracking, pallor, cyanosis, or vesicular eruption. Lower Lip-Normal: no dryness, cracking, pallor, cyanosis or vesicular eruption. Buccal Mucosa- Bilateral- No Aphthous ulcers. Oropharynx- No Discharge or Erythema. Tonsils: Characteristics- Bilateral- No Erythema or Congestion. Size/Enlargement- Bilateral- No enlargement. Discharge- bilateral-None.  Neck Neck- Supple. No Masses.   Chest and Lung Exam Auscultation: Breath Sounds:-Clear even and unlabored.  Cardiovascular Auscultation:Rythm- Regular, rate and rhythm. Murmurs & Other Heart Sounds:Ausculatation of the heart reveal- No Murmurs.  Lymphatic Head & Neck General Head & Neck Lymphatics: Bilateral: Description- No Localized lymphadenopathy.      Assessment & Plan:  You describe recent upper respiratory infection type symptoms peceeding  bronchitis type symptoms over the past 4 days.  Recent high level stress probably impacting your immune system as well.  For bronchitis, I am prescribing doxycycline antibiotic.  For cough, making benzonatate available.  Recommend staying well-hydrated, getting adequate sleep and eating healthy.  If you are not progressively feeling improved by Wednesday afternoon then recommend getting a chest x-ray.  If you are still  describing significant fatigue or you have fever then let me know as well in that event would get CBC and possibly other labs.  With recent passing of your husband, if you were having severe anxiety or insomnia please let us know as we could give you some medication to help you over the next 2 to 3 months as you grieve.  Follow-up in 7 to 10 days or as needed.   Mackie Pai, PA-C

## 2018-05-06 NOTE — Patient Instructions (Addendum)
You describe recent upper respiratory infection type symptoms peceeding  bronchitis type symptoms over the past 4 days.  Recent high level stress probably impacting your immune system as well.  For bronchitis, I am prescribing doxycycline antibiotic.  For cough, making benzonatate available.  Recommend staying well-hydrated, getting adequate sleep and eating healthy.  If you are not progressively feeling improved by Wednesday afternoon then recommend getting a chest x-ray.  If you are still describing significant fatigue or you have fever then let me know as well in that event would get CBC and possibly other labs.  With recent passing of your husband, if you were having severe anxiety or insomnia please let us know as we could give you some medication to help you over the next 2 to 3 months as you grieve.  Follow-up in 7 to 10 days or as needed.

## 2018-05-08 ENCOUNTER — Ambulatory Visit (HOSPITAL_BASED_OUTPATIENT_CLINIC_OR_DEPARTMENT_OTHER)
Admission: RE | Admit: 2018-05-08 | Discharge: 2018-05-08 | Disposition: A | Discharge status: Home / Self Care | Payer: BLUE CROSS/BLUE SHIELD | Source: Ambulatory Visit | Attending: Medical | Admitting: Medical

## 2018-05-08 ENCOUNTER — Other Ambulatory Visit: Payer: Self-pay

## 2018-05-08 ENCOUNTER — Telehealth: Payer: Self-pay | Admitting: Medical

## 2018-05-08 ENCOUNTER — Other Ambulatory Visit (INDEPENDENT_AMBULATORY_CARE_PROVIDER_SITE_OTHER): Payer: BLUE CROSS/BLUE SHIELD

## 2018-05-08 ENCOUNTER — Encounter (HOSPITAL_BASED_OUTPATIENT_CLINIC_OR_DEPARTMENT_OTHER): Payer: Self-pay | Admitting: Emergency Medicine

## 2018-05-08 ENCOUNTER — Encounter: Payer: Self-pay | Admitting: Medical

## 2018-05-08 ENCOUNTER — Emergency Department (HOSPITAL_BASED_OUTPATIENT_CLINIC_OR_DEPARTMENT_OTHER)
Admission: EM | Admit: 2018-05-08 | Discharge: 2018-05-08 | Disposition: A | Discharge status: Home / Self Care | Payer: BLUE CROSS/BLUE SHIELD | Attending: Emergency Medicine | Admitting: Emergency Medicine

## 2018-05-08 ENCOUNTER — Telehealth: Payer: Self-pay | Admitting: *Deleted

## 2018-05-08 DIAGNOSIS — E039 Hypothyroidism, unspecified: Secondary | ICD-10-CM | POA: Diagnosis not present

## 2018-05-08 DIAGNOSIS — R05 Cough: Secondary | ICD-10-CM

## 2018-05-08 DIAGNOSIS — R110 Nausea: Secondary | ICD-10-CM | POA: Diagnosis not present

## 2018-05-08 DIAGNOSIS — Z79899 Other long term (current) drug therapy: Secondary | ICD-10-CM | POA: Diagnosis not present

## 2018-05-08 DIAGNOSIS — R5383 Other fatigue: Secondary | ICD-10-CM

## 2018-05-08 DIAGNOSIS — I10 Essential (primary) hypertension: Secondary | ICD-10-CM | POA: Insufficient documentation

## 2018-05-08 DIAGNOSIS — R059 Cough, unspecified: Secondary | ICD-10-CM

## 2018-05-08 DIAGNOSIS — R531 Weakness: Secondary | ICD-10-CM | POA: Diagnosis not present

## 2018-05-08 DIAGNOSIS — Z85828 Personal history of other malignant neoplasm of skin: Secondary | ICD-10-CM | POA: Diagnosis not present

## 2018-05-08 DIAGNOSIS — E876 Hypokalemia: Secondary | ICD-10-CM | POA: Insufficient documentation

## 2018-05-08 LAB — CBC WITH DIFFERENTIAL/PLATELET
Basophils Absolute: 0 10*3/uL (ref 0.0–0.1)
Basophils Relative: 0.6 % (ref 0.0–3.0)
Eosinophils Absolute: 0.1 10*3/uL (ref 0.0–0.7)
Eosinophils Relative: 1.2 % (ref 0.0–5.0)
HCT: 42.6 % (ref 36.0–46.0)
HEMOGLOBIN: 14.7 g/dL (ref 12.0–15.0)
Lymphocytes Relative: 44.8 % (ref 12.0–46.0)
Lymphs Abs: 3.3 10*3/uL (ref 0.7–4.0)
MCHC: 34.5 g/dL (ref 30.0–36.0)
MCV: 83.9 fl (ref 78.0–100.0)
Monocytes Absolute: 0.4 10*3/uL (ref 0.1–1.0)
Monocytes Relative: 5.7 % (ref 3.0–12.0)
Neutro Abs: 3.5 10*3/uL (ref 1.4–7.7)
Neutrophils Relative %: 47.7 % (ref 43.0–77.0)
Platelets: 298 10*3/uL (ref 150.0–400.0)
RBC: 5.08 Mil/uL (ref 3.87–5.11)
RDW: 12.6 % (ref 11.5–15.5)
WBC: 7.3 10*3/uL (ref 4.0–10.5)

## 2018-05-08 LAB — CBC
HCT: 42.8 % (ref 36.0–46.0)
Hemoglobin: 14.7 g/dL (ref 12.0–15.0)
MCH: 28.5 pg (ref 26.0–34.0)
MCHC: 34.3 g/dL (ref 30.0–36.0)
MCV: 82.9 fL (ref 80.0–100.0)
Platelets: 281 10*3/uL (ref 150–400)
RBC: 5.16 MIL/uL — ABNORMAL HIGH (ref 3.87–5.11)
RDW: 11.9 % (ref 11.5–15.5)
WBC: 8.1 10*3/uL (ref 4.0–10.5)
nRBC: 0 % (ref 0.0–0.2)

## 2018-05-08 LAB — COMPREHENSIVE METABOLIC PANEL
ALBUMIN: 4.3 g/dL (ref 3.5–5.2)
ALT: 25 U/L (ref 0–35)
AST: 19 U/L (ref 0–37)
Alkaline Phosphatase: 72 U/L (ref 39–117)
BUN: 12 mg/dL (ref 6–23)
CO2: 28 mEq/L (ref 19–32)
Calcium: 9.3 mg/dL (ref 8.4–10.5)
Chloride: 95 mEq/L — ABNORMAL LOW (ref 96–112)
Creatinine, Ser: 0.76 mg/dL (ref 0.40–1.20)
GFR: 76.1 mL/min (ref >60.00)
Glucose, Bld: 122 mg/dL — ABNORMAL HIGH (ref 70–99)
POTASSIUM: 2.7 meq/L — AB (ref 3.5–5.1)
Sodium: 134 mEq/L — ABNORMAL LOW (ref 135–145)
Total Bilirubin: 0.6 mg/dL (ref 0.2–1.2)
Total Protein: 7.3 g/dL (ref 6.0–8.3)

## 2018-05-08 LAB — BASIC METABOLIC PANEL
Anion gap: 9 (ref 5–15)
BUN: 13 mg/dL (ref 8–23)
CO2: 26 mmol/L (ref 22–32)
Calcium: 8.5 mg/dL — ABNORMAL LOW (ref 8.9–10.3)
Chloride: 96 mmol/L — ABNORMAL LOW (ref 98–111)
Creatinine, Ser: 0.72 mg/dL (ref 0.44–1.00)
GFR calc Af Amer: >60 mL/min (ref >60)
GFR calc non Af Amer: >60 mL/min (ref >60)
Glucose, Bld: 100 mg/dL — ABNORMAL HIGH (ref 70–99)
Potassium: 2.9 mmol/L — ABNORMAL LOW (ref 3.5–5.1)
Sodium: 131 mmol/L — ABNORMAL LOW (ref 135–145)

## 2018-05-08 LAB — MAGNESIUM: Magnesium: 1.7 mg/dL (ref 1.7–2.4)

## 2018-05-08 LAB — CBG MONITORING, ED: Glucose-Capillary: 92 mg/dL (ref 70–99)

## 2018-05-08 MED ORDER — POTASSIUM CHLORIDE 10 MEQ/100ML IV SOLN
10.0000 meq | INTRAVENOUS | Status: AC
  Administered 2018-05-08 (×3): 10 meq via INTRAVENOUS
  Filled 2018-05-08 (×3): qty 100

## 2018-05-08 MED ORDER — SODIUM CHLORIDE 0.9% FLUSH
3.0000 mL | Freq: Once | INTRAVENOUS | Status: DC
  Filled 2018-05-08: qty 3

## 2018-05-08 MED ORDER — SODIUM CHLORIDE 0.9 % IV SOLN
INTRAVENOUS | Status: DC | PRN
  Administered 2018-05-08: 19:00:00 via INTRAVENOUS

## 2018-05-08 MED ORDER — POTASSIUM CHLORIDE CRYS ER 20 MEQ PO TBCR
40.0000 meq | EXTENDED_RELEASE_TABLET | Freq: Once | ORAL | Status: AC
  Administered 2018-05-08: 40 meq via ORAL
  Filled 2018-05-08: qty 2

## 2018-05-08 MED ORDER — POTASSIUM CHLORIDE CRYS ER 20 MEQ PO TBCR: 20.0000 meq | EXTENDED_RELEASE_TABLET | Freq: Two times a day (BID) | ORAL | 0 refills | Status: DC

## 2018-05-08 NOTE — Telephone Encounter (Signed)
Labs placed after seeing pt my chart message. Will you coordinate with lab and get her scheduled today. Pt also coming in for chest xray.

## 2018-05-08 NOTE — Telephone Encounter (Signed)
CRITICAL VALUE STICKER  CRITICAL VALUE: Potassium  2.7  RECEIVER (on-site recipient of call): Aldona Lento  DATE & TIME NOTIFIED: 05/08/18 @ 4:35pm  MESSENGER (representative from lab): Roger Kill  MD NOTIFIED: PA, Saguier / CMA, [place]  TIME OF NOTIFICATION: 4:36p  RESPONSE:

## 2018-05-08 NOTE — ED Triage Notes (Signed)
Patient sent from physician to check on low potassium and weakness after a cold. Reported potassium is 2.7

## 2018-05-08 NOTE — ED Notes (Signed)
IV attempt x2 with success.  Patient tol well RN aware.

## 2018-05-08 NOTE — Addendum Note (Signed)
Addended by: Kelle Darting A on: 05/08/2018 02:19 PM   Modules accepted: Orders

## 2018-05-08 NOTE — ED Provider Notes (Signed)
[facility] EMERGENCY DEPARTMENT Provider Note   CSN: [id number] Arrival date & time: 05/08/18  1731    History   Chief Complaint Chief Complaint  Patient presents with  . Weakness  . Abnormal Lab    HPI Amy Zimmerman is a 68 y.o. female.     HPI   One week of cough, congestion. Fever on Saturday.  Cough improving.  No shortness of breath. Generalized.  No vomiting, mild nausea.  No diarrhea. No medication changes. Put on doxycyline 3/2 placed on doxycycline, started it 06-27-22 night. Husband died suddenly over the weekend. Feels nausea likely from grieving.   Past Medical History:  Diagnosis Date  . Hypertension     Patient Active Problem List   Diagnosis Date Noted  . Melanoma ([facility]) 04/22/2018  . HYPOTHYROIDISM 01/07/2010  . POSTMENOPAUSAL STATUS 02/01/2009  . Hyperlipidemia LDL goal <100 03/30/2008  . Essential hypertension 08/06/2006    Past Surgical History:  Procedure Laterality Date  . COLONOSCOPY    . uterine polyps       OB History   No obstetric history on file.      Home Medications    Prior to Admission medications   Medication Sig Start Date End Date Taking? Authorizing Provider  benzonatate (TESSALON) 100 MG capsule Take 1 capsule (100 mg total) by mouth 3 (three) times daily as needed for cough. 05/06/18   Saguier, Percell Miller, PA-C  calcium carbonate (OS-CAL) 600 MG TABS Take 600 mg by mouth 2 (two) times daily with a meal.      [provider]  doxycycline (VIBRA-TABS) 100 MG tablet Take 1 tablet (100 mg total) by mouth 2 (two) times daily. 05/06/18   Saguier, Percell Miller, PA-C  hydrochlorothiazide (HYDRODIURIL) 50 MG tablet TAKE 1 TABLET DAILY 03/11/18   Carollee Herter, Alferd Apa, DO  metoprolol succinate (TOPROL-XL) 100 MG 24 hr tablet TAKE 1 TABLET BY MOUTH EVERY DAY TAKE WITH OR IMMEDIATELY FOLLOWING A MEAL.  Due for OV 05/06/18   Ann Held, DO  Omega-3 Fatty Acids (OMEGA-3 FISH OIL) 500 MG CAPS Take 1 capsule by mouth 2 (two)  times daily.    [provider]  potassium chloride SA (K-DUR,KLOR-CON) 20 MEQ tablet Take 1 tablet (20 mEq total) by mouth 2 (two) times daily for 5 days. 05/08/18 05/13/18  Gareth Morgan, MD    Family History Family History  Problem Relation Age of Onset  . Heart disease Mother   . Kidney disease Mother   . Hypertension Mother   . Heart disease Father 26       MI  . Hyperlipidemia Sister   . Hyperlipidemia Sister   . Colon cancer Neg Hx   . Stomach cancer Neg Hx     Social History Social History   Tobacco Use  . Smoking status: Never Smoker  . Smokeless tobacco: Never Used  Substance Use Topics  . Alcohol use: No  . Drug use: No     Allergies   Patient has no known allergies.   Review of Systems Review of Systems  Constitutional: Negative for fever.  HENT: Positive for congestion. Negative for sore throat.   Eyes: Negative for visual disturbance.  Respiratory: Positive for cough. Negative for shortness of breath.   Cardiovascular: Negative for chest pain.  Gastrointestinal: Positive for nausea. Negative for abdominal pain and diarrhea.  Genitourinary: Negative for difficulty urinating.  Musculoskeletal: Positive for myalgias. Negative for back pain.  Skin: Negative for rash.  Neurological: Negative  for syncope and headaches.     Physical Exam Updated Vital Signs BP 132/80   Pulse 76   Temp 97.8 F (36.6 C) (Oral)   Resp (!) 23   Ht 5' 2.5" (1.588 m)   Wt 72.6 kg   LMP  (LMP Unknown)   SpO2 96%   BMI 28.80 kg/m   Physical Exam Vitals signs and nursing note reviewed.  Constitutional:      General: She is not in acute distress.    Appearance: She is well-developed. She is not diaphoretic.  HENT:     Head: Normocephalic and atraumatic.  Eyes:     Conjunctiva/sclera: Conjunctivae normal.  Neck:     Musculoskeletal: Normal range of motion.  Cardiovascular:     Rate and Rhythm: Normal rate and regular rhythm.     Heart sounds: Normal  heart sounds. No murmur. No friction rub. No gallop.   Pulmonary:     Effort: Pulmonary effort is normal. No respiratory distress.     Breath sounds: Normal breath sounds. No wheezing or rales.  Abdominal:     General: There is no distension.     Palpations: Abdomen is soft.     Tenderness: There is no abdominal tenderness. There is no guarding.  Musculoskeletal:        General: No tenderness.  Skin:    General: Skin is warm and dry.     Findings: No erythema or rash.  Neurological:     Mental Status: She is alert and oriented to person, place, and time.      ED Treatments / Results  Labs (all labs ordered are listed, but only abnormal results are displayed) Labs Reviewed  CBC - Abnormal; Notable for the following components:      Result Value   RBC 5.16 (*)    All other components within normal limits  BASIC METABOLIC PANEL - Abnormal; Notable for the following components:   Sodium 131 (*)    Potassium 2.9 (*)    Chloride 96 (*)    Glucose, Bld 100 (*)    Calcium 8.5 (*)    All other components within normal limits  MAGNESIUM  CBG MONITORING, ED    EKG EKG Interpretation  Date/Time:  Wednesday May 08 2018 17:54:13 EST Ventricular Rate:  75 PR Interval:    QRS Duration: 98 QT Interval:  378 QTC Calculation: 423 R Axis:   12 Text Interpretation:  Sinus rhythm Low voltage, precordial leads No previous ECGs available Confirmed by Gareth Morgan [phone]) on 05/08/2018 8:37:12 PM Also confirmed by Gareth Morgan [phone]), editor Philomena Doheny [phone])  on 05/09/2018 9:01:52 AM   Radiology Dg Chest 2 View  Result Date: 05/08/2018 CLINICAL DATA:  68 y/o  F; one week of cough and fatigue. EXAM: CHEST - 2 VIEW COMPARISON:  None. FINDINGS: Normal cardiac silhouette. Surgical sutures are present along the posterior mid upper back. Pulmonary venous hypertension. No focal consolidation. No pleural effusion or pneumothorax. No acute osseous abnormality is evident. IMPRESSION:  Pulmonary venous hypertension. No focal consolidation. Electronically Signed   By: Kristine Garbe M.D.   On: 05/08/2018 15:11    Procedures Procedures (including critical care time)  Medications Ordered in ED Medications  potassium chloride 10 mEq in 100 mL IVPB ( Intravenous Stopped 05/08/18 2142)  potassium chloride SA (K-DUR,KLOR-CON) CR tablet 40 mEq (40 mEq Oral Given 05/08/18 1835)     Initial Impression / Assessment and Plan / ED Course  I have reviewed the triage  vital signs and the nursing notes.  Pertinent labs & imaging results that were available during my care of the patient were reviewed by me and considered in my medical decision making (see chart for details).        68yo female with history of hypertension and hyperlipidemia, grieving loss of her husband over the weekend, presents with concern for generalized weakness and hypokalemia at recommendation to PCP.  Reports cough improving, is on doxycycline.  Potassium 2.7 as outpatient, 2.9 here. Given IV potassium 80meq, po K 22meq.  Hypokalemia likely in setting of hctz. Given rx for potassium, recommend PCP follow up .  Patient discharged in stable condition with understanding of reasons to return.   Final Clinical Impressions(s) / ED Diagnoses   Final diagnoses:  Hypokalemia    ED Discharge Orders         Ordered    potassium chloride SA (K-DUR,KLOR-CON) 20 MEQ tablet  2 times daily     05/08/18 2125           Gareth Morgan, MD 05/09/18 1125

## 2018-05-09 ENCOUNTER — Telehealth: Payer: Self-pay | Admitting: Medical

## 2018-05-09 DIAGNOSIS — I2729 Other secondary pulmonary hypertension: Secondary | ICD-10-CM

## 2018-05-09 NOTE — Telephone Encounter (Signed)
Referral to cardiologist placed. 

## 2018-05-14 ENCOUNTER — Encounter: Payer: Self-pay | Admitting: Medical

## 2018-05-14 ENCOUNTER — Telehealth: Payer: Self-pay | Admitting: Medical

## 2018-05-14 DIAGNOSIS — L989 Disorder of the skin and subcutaneous tissue, unspecified: Secondary | ICD-10-CM | POA: Diagnosis not present

## 2018-05-14 DIAGNOSIS — D0339 Melanoma in situ of other parts of face: Secondary | ICD-10-CM | POA: Diagnosis not present

## 2018-05-14 DIAGNOSIS — E876 Hypokalemia: Secondary | ICD-10-CM

## 2018-05-14 NOTE — Telephone Encounter (Signed)
Future cmp placed for low k.

## 2018-05-15 ENCOUNTER — Other Ambulatory Visit (INDEPENDENT_AMBULATORY_CARE_PROVIDER_SITE_OTHER): Payer: BLUE CROSS/BLUE SHIELD

## 2018-05-15 ENCOUNTER — Other Ambulatory Visit: Payer: Self-pay

## 2018-05-15 ENCOUNTER — Telehealth: Payer: Self-pay | Admitting: Medical

## 2018-05-15 DIAGNOSIS — E876 Hypokalemia: Secondary | ICD-10-CM

## 2018-05-15 LAB — COMPREHENSIVE METABOLIC PANEL
ALT: 19 U/L (ref 0–35)
AST: 15 U/L (ref 0–37)
Albumin: 4.1 g/dL (ref 3.5–5.2)
Alkaline Phosphatase: 65 U/L (ref 39–117)
BUN: 16 mg/dL (ref 6–23)
CO2: 26 mEq/L (ref 19–32)
Calcium: 9.3 mg/dL (ref 8.4–10.5)
Chloride: 97 mEq/L (ref 96–112)
Creatinine, Ser: 0.77 mg/dL (ref 0.40–1.20)
GFR: 74.95 mL/min (ref >60.00)
Glucose, Bld: 132 mg/dL — ABNORMAL HIGH (ref 70–99)
Potassium: 3.2 mEq/L — ABNORMAL LOW (ref 3.5–5.1)
Sodium: 135 mEq/L (ref 135–145)
Total Bilirubin: 0.9 mg/dL (ref 0.2–1.2)
Total Protein: 6.6 g/dL (ref 6.0–8.3)

## 2018-05-15 MED ORDER — POTASSIUM CHLORIDE CRYS ER 20 MEQ PO TBCR: 20.0000 meq | EXTENDED_RELEASE_TABLET | Freq: Three times a day (TID) | ORAL | 0 refills | Status: DC

## 2018-05-15 NOTE — Telephone Encounter (Signed)
Rx refill of potassium sent to pt pharmacy.

## 2018-05-15 NOTE — Telephone Encounter (Signed)
Future lab placed  

## 2018-05-16 ENCOUNTER — Ambulatory Visit: Payer: BLUE CROSS/BLUE SHIELD | Admitting: Cardiology

## 2018-05-16 ENCOUNTER — Encounter: Payer: Self-pay | Admitting: Cardiology

## 2018-05-16 VITALS — BP 130/62 | HR 87 | Wt 158.4 lb

## 2018-05-16 DIAGNOSIS — R0681 Apnea, not elsewhere classified: Secondary | ICD-10-CM | POA: Insufficient documentation

## 2018-05-16 DIAGNOSIS — E785 Hyperlipidemia, unspecified: Secondary | ICD-10-CM | POA: Diagnosis not present

## 2018-05-16 DIAGNOSIS — I10 Essential (primary) hypertension: Secondary | ICD-10-CM

## 2018-05-16 DIAGNOSIS — C4330 Malignant melanoma of unspecified part of face: Secondary | ICD-10-CM | POA: Diagnosis not present

## 2018-05-16 NOTE — Progress Notes (Signed)
Cardiology Consultation:    Date:  05/16/2018   ID:  Amy Zimmerman, DOB February 01, 1951, MRN [id number]  PCP:  Ann Held, DO  Cardiologist:  Jenne Campus, MD   Referring MD: Elise Benne   Chief Complaint  Patient presents with  . Follow up on X-Ray  I have pulmonary hypertension  History of Present Illness:    Amy Zimmerman is a 68 y.o. female who is being seen today for the evaluation of pulmonary hypertension at the request of Saguier, Percell Miller, [place].  Few weeks ago she lost her husband.  He died suddenly during sleep.  After that she ended up having some bronchitis that required treatment with antibiotic.  She does have longstanding hypertension that being treated with residual chlorothiazide.  Some laboratory tests were done and she was found to be significantly hypokalemic that required ER visit and multiple infusions of potassium.  Chest x-ray was done and chest x-ray was read as "pulmonary vascular hypertension".  Overall she is doing fine considering the fact that she recently lost her husband still grieving after that denies having any shortness of breath no swelling of lower extremities no proximal nocturnal dyspnea no symptoms that would suggest some heart problem.  She described to have some chest pain from time to time not related to exercise.  She never had any heart trouble.  Recently she was diagnosed with melanoma and she still got some surgical dressing on her left cheek after surgery. He does have hypertension does not have diabetes Does have dyslipidemia with LDL being more than 180. Never smoked she is originally from [place] Does not exercise on a regular basis  Past Medical History:  Diagnosis Date  . Hypertension     Past Surgical History:  Procedure Laterality Date  . COLONOSCOPY    . MOHS SURGERY     Face and Back  . uterine polyps      Current Medications: Current Meds  Medication Sig  . benzonatate (TESSALON) 100 MG capsule  Take 1 capsule (100 mg total) by mouth 3 (three) times daily as needed for cough.  . calcium carbonate (OS-CAL) 600 MG TABS Take 600 mg by mouth 2 (two) times daily with a meal.    . hydrochlorothiazide (HYDRODIURIL) 50 MG tablet TAKE 1 TABLET DAILY  . metoprolol succinate (TOPROL-XL) 100 MG 24 hr tablet TAKE 1 TABLET BY MOUTH EVERY DAY TAKE WITH OR IMMEDIATELY FOLLOWING A MEAL.  Due for OV  . Omega-3 Fatty Acids (OMEGA-3 FISH OIL) 500 MG CAPS Take 1 capsule by mouth 2 (two) times daily.  . potassium chloride SA (K-DUR,KLOR-CON) 20 MEQ tablet Take 1 tablet (20 mEq total) by mouth 3 (three) times daily for 5 days.     Allergies:   Patient has no known allergies.   Social History   Socioeconomic History  . Marital status: Widowed    Spouse name: Not on file  . Number of children: Not on file  . Years of education: Not on file  . Highest education level: Not on file  Occupational History  . Occupation: Barrister's clerk  Social Needs  . Financial resource strain: Not on file  . Food insecurity:    Worry: Not on file    Inability: Not on file  . Transportation needs:    Medical: Not on file    Non-medical: Not on file  Tobacco Use  . Smoking status: Never Smoker  . Smokeless tobacco: Never Used  Substance and  Sexual Activity  . Alcohol use: No  . Drug use: No  . Sexual activity: Yes    Partners: Male  Lifestyle  . Physical activity:    Days per week: Not on file    Minutes per session: Not on file  . Stress: Not on file  Relationships  . Social connections:    Talks on phone: Not on file    Gets together: Not on file    Attends religious service: Not on file    Active member of club or organization: Not on file    Attends meetings of clubs or organizations: Not on file    Relationship status: Not on file  Other Topics Concern  . Not on file  Social History Narrative   Exercise- no     Family History: The patient's family history includes Heart disease in  her mother; Heart disease (age of onset: 35) in her father; Hyperlipidemia in her sister and sister; Hypertension in her mother; Kidney disease in her mother. There is no history of Colon cancer or Stomach cancer. ROS:   Please see the history of present illness.    All 14 point review of systems negative except as described per history of present illness.  EKGs/Labs/Other Studies Reviewed:    The following studies were reviewed today: EKG done in the emergency room on fourth of this month showed normal sinus rhythm normal P interval normal QS complex duration morphology nonspecific ST-T segment changes    Recent Labs: 05/08/2018: Hemoglobin 14.7; Magnesium 1.7; Platelets 281 05/15/2018: ALT 19; BUN 16; Creatinine, Ser 0.77; Potassium 3.2; Sodium 135  Recent Lipid Panel    Component Value Date/Time   CHOL 271 (H) 10/30/2017 1010   TRIG 162.0 (H) 10/30/2017 1010   HDL 51.50 10/30/2017 1010   CHOLHDL 5 10/30/2017 1010   VLDL 32.4 10/30/2017 1010   LDLCALC 187 (H) 10/30/2017 1010   LDLDIRECT 159.9 11/21/2012 0850    Physical Exam:    VS:  BP 130/62   Pulse 87   Wt 158 lb 6.4 oz (71.8 kg)   LMP  (LMP Unknown)   SpO2 98%   BMI 28.51 kg/m     Wt Readings from Last 3 Encounters:  05/16/18 158 lb 6.4 oz (71.8 kg)  05/08/18 160 lb (72.6 kg)  05/06/18 160 lb 6.4 oz (72.8 kg)     GEN:  Well nourished, well developed in no acute distress HEENT: Normal NECK: No JVD; No carotid bruits LYMPHATICS: No lymphadenopathy CARDIAC: RRR, no murmurs, no rubs, no gallops RESPIRATORY:  Clear to auscultation without rales, wheezing or rhonchi  ABDOMEN: Soft, non-tender, non-distended MUSCULOSKELETAL:  No edema; No deformity  SKIN: Warm and dry NEUROLOGIC:  Alert and oriented x 3 PSYCHIATRIC:  Normal affect   ASSESSMENT:    1. Essential hypertension   2. Malignant melanoma of face excluding eyelid, nose, lip, and ear ([place])   3. Hyperlipidemia LDL goal <100   4. Breathlessness on exertion     PLAN:    In order of problems listed above:  1. Essential hypertension.  Blood pressure appears to be well controlled right now however in the future I would like to modify the therapy I would definitely try to reduce dose of hydrochlorothiazide since dose of 50 mg a day is associated with significant side effects and she already experienced some of those which was very low potassium she is scheduled to have her Chem-7 checked within the next few days.  I will schedule her  to have echocardiogram to assess left ventricular ejection fraction.  It is interestingly her chest x-ray described as pulmonary venous hypertension and truly when I look at extra look like CHF.  Overall the silhouette of the heart seems to be normal.  I will schedule her to have echocardiogram to assess left ventricular ejection fraction as well as look of pulmonary hypertension.  In the future anticipate to reduce dose of diuretic and introduce ACE inhibitor and cut down to hopefully completely discontinue potassium. 2. Dyslipidemia clearly a problem we initiated conversation about that she does not want to take any medications for it however in the future I will press hard on needs to take it. 3. The future also will talk more about healthy lifestyle with exercises on the regular basis. 4. Malignant melanoma has been removed.  Followed by dermatology   Medication Adjustments/Labs and Tests Ordered: Current medicines are reviewed at length with the patient today.  Concerns regarding medicines are outlined above.  No orders of the defined types were placed in this encounter.  No orders of the defined types were placed in this encounter.   Signed, Park Liter, MD, [facility]. 05/16/2018 3:52 PM    [place]

## 2018-05-16 NOTE — Patient Instructions (Signed)
Medication Instructions:  Your physician recommends that you continue on your current medications as directed. Please refer to the Current Medication list given to you today.  If you need a refill on your cardiac medications before your next appointment, please call your pharmacy.   Lab work: None.  If you have labs (blood work) drawn today and your tests are completely normal, you will receive your results only by: . MyChart Message (if you have MyChart) OR . A paper copy in the mail If you have any lab test that is abnormal or we need to change your treatment, we will call you to review the results.  Testing/Procedures: Your physician has requested that you have an echocardiogram. Echocardiography is a painless test that uses sound waves to create images of your heart. It provides your doctor with information about the size and shape of your heart and how well your heart's chambers and valves are working. This procedure takes approximately one hour. There are no restrictions for this procedure.    Follow-Up: At [facility], you and your health needs are our priority.  As part of our continuing mission to provide you with exceptional heart care, we have created designated Provider Care Teams.  These Care Teams include your primary Cardiologist (physician) and Advanced Practice Providers (APPs -  Physician Assistants and Nurse Practitioners) who all work together to provide you with the care you need, when you need it. You will need a follow up appointment in 1 months.  Please call our office 2 months in advance to schedule this appointment.  You may see No primary care provider on file. or another member of our [facility] Provider Team in [place]: Brian Munley, MD . Rajan Revankar, MD  Any Other Special Instructions Will Be Listed Below (If Applicable).   Echocardiogram An echocardiogram is a procedure that uses painless sound waves (ultrasound) to produce an image of the  heart. Images from an echocardiogram can provide important information about:  Signs of coronary artery disease (CAD).  Aneurysm detection. An aneurysm is a weak or damaged part of an artery wall that bulges out from the normal force of blood pumping through the body.  Heart size and shape. Changes in the size or shape of the heart can be associated with certain conditions, including heart failure, aneurysm, and CAD.  Heart muscle function.  Heart valve function.  Signs of a past heart attack.  Fluid buildup around the heart.  Thickening of the heart muscle.  A tumor or infectious growth around the heart valves. Tell a health care provider about:  Any allergies you have.  All medicines you are taking, including vitamins, herbs, eye drops, creams, and over-the-counter medicines.  Any blood disorders you have.  Any surgeries you have had.  Any medical conditions you have.  Whether you are pregnant or may be pregnant. What are the risks? Generally, this is a safe procedure. However, problems may occur, including:  Allergic reaction to dye (contrast) that may be used during the procedure. What happens before the procedure? No specific preparation is needed. You may eat and drink normally. What happens during the procedure?   An IV tube may be inserted into one of your veins.  You may receive contrast through this tube. A contrast is an injection that improves the quality of the pictures from your heart.  A gel will be applied to your chest.  A wand-like tool (transducer) will be moved over your chest. The gel will help   to transmit the sound waves from the transducer.  The sound waves will harmlessly bounce off of your heart to allow the heart images to be captured in real-time motion. The images will be recorded on a computer. The procedure may vary among health care providers and hospitals. What happens after the procedure?  You may return to your normal, everyday  life, including diet, activities, and medicines, unless your health care provider tells you not to do that. Summary  An echocardiogram is a procedure that uses painless sound waves (ultrasound) to produce an image of the heart.  Images from an echocardiogram can provide important information about the size and shape of your heart, heart muscle function, heart valve function, and fluid buildup around your heart.  You do not need to do anything to prepare before this procedure. You may eat and drink normally.  After the echocardiogram is completed, you may return to your normal, everyday life, unless your health care provider tells you not to do that. This information is not intended to replace advice given to you by your health care provider. Make sure you discuss any questions you have with your health care provider. Document Released: 02/18/2000 Document Revised: 03/25/2016 Document Reviewed: 03/25/2016 Elsevier Interactive Patient Education  2019 [employer].

## 2018-05-17 ENCOUNTER — Telehealth: Payer: Self-pay | Admitting: Medical

## 2018-05-17 DIAGNOSIS — E785 Hyperlipidemia, unspecified: Secondary | ICD-10-CM

## 2018-05-17 NOTE — Telephone Encounter (Signed)
Future lipid panel placed. 

## 2018-05-22 ENCOUNTER — Ambulatory Visit (HOSPITAL_BASED_OUTPATIENT_CLINIC_OR_DEPARTMENT_OTHER)
Admission: RE | Admit: 2018-05-22 | Discharge: 2018-05-22 | Disposition: A | Discharge status: Home / Self Care | Payer: BLUE CROSS/BLUE SHIELD | Source: Ambulatory Visit | Attending: Cardiology | Admitting: Cardiology

## 2018-05-22 ENCOUNTER — Other Ambulatory Visit (INDEPENDENT_AMBULATORY_CARE_PROVIDER_SITE_OTHER): Payer: BLUE CROSS/BLUE SHIELD

## 2018-05-22 ENCOUNTER — Other Ambulatory Visit: Payer: Self-pay

## 2018-05-22 DIAGNOSIS — R0681 Apnea, not elsewhere classified: Secondary | ICD-10-CM | POA: Diagnosis not present

## 2018-05-22 DIAGNOSIS — I10 Essential (primary) hypertension: Secondary | ICD-10-CM | POA: Insufficient documentation

## 2018-05-22 DIAGNOSIS — E876 Hypokalemia: Secondary | ICD-10-CM

## 2018-05-22 DIAGNOSIS — E785 Hyperlipidemia, unspecified: Secondary | ICD-10-CM | POA: Diagnosis not present

## 2018-05-22 LAB — COMPREHENSIVE METABOLIC PANEL
ALT: 22 U/L (ref 0–35)
AST: 17 U/L (ref 0–37)
Albumin: 4 g/dL (ref 3.5–5.2)
Alkaline Phosphatase: 58 U/L (ref 39–117)
BUN: 11 mg/dL (ref 6–23)
CO2: 29 mEq/L (ref 19–32)
Calcium: 9.7 mg/dL (ref 8.4–10.5)
Chloride: 99 mEq/L (ref 96–112)
Creatinine, Ser: 0.75 mg/dL (ref 0.40–1.20)
GFR: 77.26 mL/min (ref >60.00)
Glucose, Bld: 95 mg/dL (ref 70–99)
Potassium: 3.7 mEq/L (ref 3.5–5.1)
Sodium: 135 mEq/L (ref 135–145)
TOTAL PROTEIN: 6.8 g/dL (ref 6.0–8.3)
Total Bilirubin: 0.8 mg/dL (ref 0.2–1.2)

## 2018-05-22 LAB — LIPID PANEL
Cholesterol: 223 mg/dL — ABNORMAL HIGH (ref 0–200)
HDL: 51 mg/dL (ref >39.00)
LDL Cholesterol: 141 mg/dL — ABNORMAL HIGH (ref 0–99)
NonHDL: 171.71
Total CHOL/HDL Ratio: 4
Triglycerides: 155 mg/dL — ABNORMAL HIGH (ref 0.0–149.0)
VLDL: 31 mg/dL (ref 0.0–40.0)

## 2018-05-22 NOTE — Progress Notes (Signed)
  Echocardiogram 2D Echocardiogram has been performed.  Amy Zimmerman 05/22/2018, 1:42 PM

## 2018-05-27 ENCOUNTER — Other Ambulatory Visit: Payer: Self-pay | Admitting: Family Medicine

## 2018-05-27 ENCOUNTER — Other Ambulatory Visit (HOSPITAL_BASED_OUTPATIENT_CLINIC_OR_DEPARTMENT_OTHER): Payer: BLUE CROSS/BLUE SHIELD

## 2018-05-27 DIAGNOSIS — I10 Essential (primary) hypertension: Secondary | ICD-10-CM

## 2018-05-27 NOTE — Telephone Encounter (Signed)
Sent mychart message to pt to clarifiy pharmacy for HCTZ refill request. Last Rx went to [facility] and they should still have refills on file. Today's request came from [facility]. Awaiting pt reply.

## 2018-06-19 ENCOUNTER — Telehealth: Payer: Self-pay | Admitting: Cardiology

## 2018-06-19 NOTE — Telephone Encounter (Signed)
Virtual Visit Pre-Appointment Phone Call  Steps For Call:  1. Confirm consent - "In the setting of the current Covid19 crisis, you are scheduled for a (phone or video) visit with your provider on (date) at (time).  Just as we do with many in-office visits, in order for you to participate in this visit, we must obtain consent.  If you'd like, I can send this to your mychart (if signed up) or email for you to review.  Otherwise, I can obtain your verbal consent now.  All virtual visits are billed to your insurance company just like a normal visit would be.  By agreeing to a virtual visit, we'd like you to understand that the technology does not allow for your provider to perform an examination, and thus may limit your provider's ability to fully assess your condition.  Finally, though the technology is pretty good, we cannot assure that it will always work on either your or our end, and in the setting of a video visit, we may have to convert it to a phone-only visit.  In either situation, we cannot ensure that we have a secure connection.  Are you willing to proceed?" STAFF: Did the patient verbally acknowledge consent to telehealth visit? Document YES/NO here: YES  2. Confirm the BEST phone number to call the day of the visit by including in appointment notes  3. Give patient instructions for WebEx/MyChart download to smartphone as below or Doximity/Doxy.me if video visit (depending on what platform provider is using)  4. Advise patient to be prepared with their blood pressure, heart rate, weight, any heart rhythm information, their current medicines, and a piece of paper and pen handy for any instructions they may receive the day of their visit  5. Inform patient they will receive a phone call 15 minutes prior to their appointment time (may be from unknown caller ID) so they should be prepared to answer  6. Confirm that appointment type is correct in Epic appointment notes (VIDEO vs  PHONE)     TELEPHONE CALL NOTE  Amy Zimmerman has been deemed a candidate for a follow-up tele-health visit to limit community exposure during the Covid-19 pandemic. I spoke with the patient via phone to ensure availability of phone/video source, confirm preferred email & phone number, and discuss instructions and expectations.  I reminded Amy Zimmerman to be prepared with any vital sign and/or heart rhythm information that could potentially be obtained via home monitoring, at the time of her visit. I reminded Amy Zimmerman to expect a phone call at the time of her visit if her visit.  Amy Zimmerman 06/19/2018 11:32 AM   INSTRUCTIONS FOR DOWNLOADING THE WEBEX APP TO SMARTPHONE  - If Apple, ask patient to go to App Store and type in WebEx in the search bar. [place] [employer], the blue/green circle. If Android, go to [employer] and type in [employer] in the search bar. The app is free but as with any other app downloads, their phone may require them to verify saved payment information or Apple/Android password.  - The patient does NOT have to create an account. - On the day of the visit, the assist will walk the patient through joining the meeting with the meeting number/password.  INSTRUCTIONS FOR DOWNLOADING THE MYCHART APP TO SMARTPHONE  - The patient must first make sure to have activated MyChart and know their login information - If Apple, go to [employer] and type in  MyChart in the search bar and download the app. If Android, ask patient to go to [employer] and type in [place]dem in the search bar and download the app. The app is free but as with any other app downloads, their phone may require them to verify saved payment information or Apple/Android password.  - The patient will need to then log into the app with their MyChart username and password, and select [facility] as their healthcare provider to link the account. When it is time for your visit, go to  the MyChart app, find appointments, and click Begin Video Visit. Be sure to Select Allow for your device to access the Microphone and Camera for your visit. You will then be connected, and your provider will be with you shortly.  **If they have any issues connecting, or need assistance please contact MyChart service desk [phone])**  **If using a computer, in order to ensure the best quality for their visit they will need to use either of the following Internet Browsers: [employer], or Google Chrome**  IF USING DOXIMITY or DOXY.ME - The patient will receive a link just prior to their visit, either by text or email (to be determined day of appointment depending on if it's doxy.me or Doximity).     FULL LENGTH CONSENT FOR TELE-HEALTH VISIT   I hereby voluntarily request, consent and authorize [facility] and its employed or contracted physicians, physician assistants, nurse practitioners or other licensed health care professionals (the Practitioner), to provide me with telemedicine health care services (the Services") as deemed necessary by the treating Practitioner. I acknowledge and consent to receive the Services by the Practitioner via telemedicine. I understand that the telemedicine visit will involve communicating with the Practitioner through live audiovisual communication technology and the disclosure of certain medical information by electronic transmission. I acknowledge that I have been given the opportunity to request an in-person assessment or other available alternative prior to the telemedicine visit and am voluntarily participating in the telemedicine visit.  I understand that I have the right to withhold or withdraw my consent to the use of telemedicine in the course of my care at any time, without affecting my right to future care or treatment, and that the Practitioner or I may terminate the telemedicine visit at any time. I understand that I have the right to  inspect all information obtained and/or recorded in the course of the telemedicine visit and may receive copies of available information for a reasonable fee.  I understand that some of the potential risks of receiving the Services via telemedicine include:   Delay or interruption in medical evaluation due to technological equipment failure or disruption;  Information transmitted may not be sufficient (e.g. poor resolution of images) to allow for appropriate medical decision making by the Practitioner; and/or   In rare instances, security protocols could fail, causing a breach of personal health information.  Furthermore, I acknowledge that it is my responsibility to provide information about my medical history, conditions and care that is complete and accurate to the best of my ability. I acknowledge that Practitioner's advice, recommendations, and/or decision may be based on factors not within their control, such as incomplete or inaccurate data provided by me or distortions of diagnostic images or specimens that may result from electronic transmissions. I understand that the practice of medicine is not an exact science and that Practitioner makes no warranties or guarantees regarding treatment outcomes. I acknowledge that I will receive a copy  of this consent concurrently upon execution via email to the email address I last provided but may also request a printed copy by calling the office of [facility].    I understand that my insurance will be billed for this visit.   I have read or had this consent read to me.  I understand the contents of this consent, which adequately explains the benefits and risks of the Services being provided via telemedicine.   I have been provided ample opportunity to ask questions regarding this consent and the Services and have had my questions answered to my satisfaction.  I give my informed consent for the services to be provided through the use of  telemedicine in my medical care  By participating in this telemedicine visit I agree to the above.

## 2018-06-25 ENCOUNTER — Telehealth (INDEPENDENT_AMBULATORY_CARE_PROVIDER_SITE_OTHER): Payer: BLUE CROSS/BLUE SHIELD | Admitting: Cardiology

## 2018-06-25 ENCOUNTER — Encounter: Payer: Self-pay | Admitting: Cardiology

## 2018-06-25 ENCOUNTER — Other Ambulatory Visit: Payer: Self-pay

## 2018-06-25 VITALS — BP 129/76

## 2018-06-25 DIAGNOSIS — R0681 Apnea, not elsewhere classified: Secondary | ICD-10-CM

## 2018-06-25 DIAGNOSIS — E785 Hyperlipidemia, unspecified: Secondary | ICD-10-CM | POA: Diagnosis not present

## 2018-06-25 DIAGNOSIS — I10 Essential (primary) hypertension: Secondary | ICD-10-CM

## 2018-06-25 MED ORDER — ATORVASTATIN CALCIUM 10 MG PO TABS: 10.0000 mg | ORAL_TABLET | Freq: Every day | ORAL | 3 refills | Status: DC

## 2018-06-25 MED ORDER — HYDROCHLOROTHIAZIDE 50 MG PO TABS: 25.0000 mg | ORAL_TABLET | Freq: Every day | ORAL | 1 refills | Status: DC

## 2018-06-25 NOTE — Patient Instructions (Addendum)
Medication Instructions:  Your physician has recommended you make the following change in your medication: * START: LIPITOR 10 mg (1 tab) daily START: Hydrochlorothiazide 25 mg (0.5 tab) daily  If you need a refill on your cardiac medications before your next appointment, please call your pharmacy.   Lab work: Your physician recommends that you return for lab work in: 6-8 weeks after starting Lipitor LIPIDS, HEPATIC FUNCTION  If you have labs (blood work) drawn today and your tests are completely normal, you will receive your results only by: [facility] MyChart Message (if you have MyChart) OR . A paper copy in the mail If you have any lab test that is abnormal or we need to change your treatment, we will call you to review the results.  Testing/Procedures: None  Follow-Up: At [facility], you and your health needs are our priority.  As part of our continuing mission to provide you with exceptional heart care, we have created designated Provider Care Teams.  These Care Teams include your primary Cardiologist (physician) and Advanced Practice Providers (APPs -  Physician Assistants and Nurse Practitioners) who all work together to provide you with the care you need, when you need it. You will need a follow up appointment in 2 months.  Any Other Special Instructions Will Be Listed Below (If Applicable).   Atorvastatin tablets What is this medicine? ATORVASTATIN (a TORE va sta tin) is known as a HMG-CoA reductase inhibitor or 'statin'. It lowers the level of cholesterol and triglycerides in the blood. This drug may also reduce the risk of heart attack, stroke, or other health problems in patients with risk factors for heart disease. Diet and lifestyle changes are often used with this drug. This medicine may be used for other purposes; ask your health care provider or pharmacist if you have questions. COMMON BRAND NAME(S): Lipitor What should I tell my health care provider before I take this  medicine? They need to know if you have any of these conditions: -diabetes -if you often drink alcohol -history of stroke -kidney disease -liver disease -muscle aches or weakness -thyroid disease -an unusual or allergic reaction to atorvastatin, other medicines, foods, dyes, or preservatives -pregnant or trying to get pregnant -breast-feeding How should I use this medicine? Take this medicine by mouth with a glass of water. Follow the directions on the prescription label. You can take it with or without food. If it upsets your stomach, take it with food. Do not take with grapefruit juice. Take your medicine at regular intervals. Do not take it more often than directed. Do not stop taking except on your doctor's advice. Talk to your pediatrician regarding the use of this medicine in children. While this drug may be prescribed for children as young as 10 for selected conditions, precautions do apply. Overdosage: If you think you have taken too much of this medicine contact a poison control center or emergency room at once. NOTE: This medicine is only for you. Do not share this medicine with others. What if I miss a dose? If you miss a dose, take it as soon as you can. If your next dose is to be taken in less than 12 hours, then do not take the missed dose. Take the next dose at your regular time. Do not take double or extra doses. What may interact with this medicine? Do not take this medicine with any of the following medications: -dasabuvir; ombitasvir; paritaprevir; ritonavir -ombitasvir; paritaprevir; ritonavir -posaconazole -red yeast rice This medicine may also  interact with the following medications: -alcohol -birth control pills -certain antibiotics like erythromycin and clarithromycin -certain antivirals for HIV or hepatitis -certain medicines for cholesterol like fenofibrate, gemfibrozil, and niacin -certain medicines for fungal infections like ketoconazole and  itraconazole -colchicine -cyclosporine -digoxin -grapefruit juice -rifampin This list may not describe all possible interactions. Give your health care provider a list of all the medicines, herbs, non-prescription drugs, or dietary supplements you use. Also tell them if you smoke, drink alcohol, or use illegal drugs. Some items may interact with your medicine. What should I watch for while using this medicine? Visit your doctor or health care professional for regular check-ups. You may need regular tests to make sure your liver is working properly. Your health care professional may tell you to stop taking this medicine if you develop muscle problems. If your muscle problems do not go away after stopping this medicine, contact your health care professional. Do not become pregnant while taking this medicine. Women should inform their health care professional if they wish to become pregnant or think they might be pregnant. There is a potential for serious side effects to an unborn child. Talk to your health care professional or pharmacist for more information. Do not breast-feed an infant while taking this medicine. This medicine may affect blood sugar levels. If you have diabetes, check with your doctor or health care professional before you change your diet or the dose of your diabetic medicine. If you are going to need surgery or other procedure, tell your doctor that you are using this medicine. This drug is only part of a total heart-health program. Your doctor or a dietician can suggest a low-cholesterol and low-fat diet to help. Avoid alcohol and smoking, and keep a proper exercise schedule. This medicine may cause a decrease in Co-Enzyme Q-10. You should make sure that you get enough Co-Enzyme Q-10 while you are taking this medicine. Discuss the foods you eat and the vitamins you take with your health care professional. What side effects may I notice from receiving this medicine? Side effects  that you should report to your doctor or health care professional as soon as possible: -allergic reactions like skin rash, itching or hives, swelling of the face, lips, or tongue -fever -joint pain -loss of memory -redness, blistering, peeling or loosening of the skin, including inside the mouth -signs and symptoms of liver injury like dark yellow or brown urine; general ill feeling or flu-like symptoms; light-belly pain; unusually weak or tired; yellowing of the eyes or skin -signs and symptoms of muscle injury like dark urine; trouble passing urine or change in the amount of urine; unusually weak or tired; muscle pain or side or back pain Side effects that usually do not require medical attention (report to your doctor or health care professional if they continue or are bothersome): -diarrhea -nausea -stomach pain -trouble sleeping -upset stomach This list may not describe all possible side effects. Call your doctor for medical advice about side effects. You may report side effects to FDA at 1-800-FDA-1088. Where should I keep my medicine? Keep out of the reach of children. Store between 20 and 25 degrees C (68 and 77 degrees F). Throw away any unused medicine after the expiration date. NOTE: This sheet is a summary. It may not cover all possible information. If you have questions about this medicine, talk to your doctor, pharmacist, or health care provider.  2019 Elsevier/Gold Standard (2017-06-22 11:36:48)

## 2018-06-25 NOTE — Progress Notes (Signed)
Virtual Visit via Video Note   This visit type was conducted due to national recommendations for restrictions regarding the COVID-19 Pandemic (e.g. social distancing) in an effort to limit this patient's exposure and mitigate transmission in our community.  Due to her co-morbid illnesses, this patient is at least at moderate risk for complications without adequate follow up.  This format is felt to be most appropriate for this patient at this time.  All issues noted in this document were discussed and addressed.  A limited physical exam was performed with this format.  Please refer to the patient's chart for her consent to telehealth for [facility].  Evaluation Performed:  Follow-up visit  This visit type was conducted due to national recommendations for restrictions regarding the COVID-19 Pandemic (e.g. social distancing).  This format is felt to be most appropriate for this patient at this time.  All issues noted in this document were discussed and addressed.  No physical exam was performed (except for noted visual exam findings with Video Visits).  Please refer to the patient's chart (MyChart message for video visits and phone note for telephone visits) for the patient's consent to telehealth for Amy Zimmerman.  Date:  06/25/2018  ID: QUORRA ROSENE, DOB 06-24-1950, MRN [id number]   Patient Location[place] [address]   Provider location:   [place] Office  PCP:  Carollee Herter, Alferd Apa, DO  Cardiologist:  Jenne Campus, MD     Chief Complaint: Doing well  History of Present Illness:    BAYLYNN SHIFFLETT is a 68 y.o. female  who presents via audio/video conferencing for a telehealth visit today.  She was referred to me because of difficulty controlling her hypertension.  Overall she is doing well and her blood pressure appears to be well controlled.  I did echocardiogram which showed normal left ventricular ejection fraction, mild left ventricle  hypertrophy, no evidence of pulmonary hypertension.  She is keeping up with restriction of coronavirus.  She still trying to be active.  Denies have any chest pain, tightness, squeezing, pressure, burning in the chest   The patient does not have symptoms concerning for COVID-19 infection (fever, chills, cough, or new SHORTNESS OF BREATH).    Prior CV studies:   The following studies were reviewed today:  Echocardiogram from May 22, 2018 showed   1. The left ventricle has normal systolic function with an ejection fraction of 60-65%. The cavity size was normal. There is mild concentric left ventricular hypertrophy. Left ventricular diastolic parameters were normal.  2. The right ventricle has normal systolic function TAPSE and S velocity. The cavity was normal. There is no increase in right ventricular wall thickness. The PA systolic pressure is normal.  3. No evidence of mitral valve stenosis.     Past Medical History:  Diagnosis Date  . Hypertension     Past Surgical History:  Procedure Laterality Date  . COLONOSCOPY    . MOHS SURGERY     Face and Back  . uterine polyps       Current Meds  Medication Sig  . calcium carbonate (OS-CAL) 600 MG TABS Take 600 mg by mouth 2 (two) times daily with a meal.    . hydrochlorothiazide (HYDRODIURIL) 50 MG tablet TAKE 1 TABLET DAILY  . metoprolol succinate (TOPROL-XL) 100 MG 24 hr tablet TAKE 1 TABLET BY MOUTH EVERY DAY TAKE WITH OR IMMEDIATELY FOLLOWING A MEAL.  Due for OV  . Omega-3 Fatty Acids (  OMEGA-3 FISH OIL) 500 MG CAPS Take 1 capsule by mouth 2 (two) times daily.      Family History: The patient's family history includes Heart disease in her mother; Heart disease (age of onset: 49) in her father; Hyperlipidemia in her sister and sister; Hypertension in her mother; Kidney disease in her mother. There is no history of Colon cancer or Stomach cancer.   ROS:   Please see the history of present illness.     All other systems  reviewed and are negative.   Labs/Other Tests and Data Reviewed:     Recent Labs: 05/08/2018: Hemoglobin 14.7; Magnesium 1.7; Platelets 281 05/22/2018: ALT 22; BUN 11; Creatinine, Ser 0.75; Potassium 3.7; Sodium 135  Recent Lipid Panel    Component Value Date/Time   CHOL 223 (H) 05/22/2018 0722   TRIG 155.0 (H) 05/22/2018 0722   HDL 51.00 05/22/2018 0722   CHOLHDL 4 05/22/2018 0722   VLDL 31.0 05/22/2018 0722   LDLCALC 141 (H) 05/22/2018 0722   LDLDIRECT 159.9 11/21/2012 0850      Exam:    Vital Signs:  BP 129/76 (BP Location: Right Arm)   LMP  (LMP Unknown)     Wt Readings from Last 3 Encounters:  05/16/18 158 lb 6.4 oz (71.8 kg)  05/08/18 160 lb (72.6 kg)  05/06/18 160 lb 6.4 oz (72.8 kg)     Well nourished, well developed in no acute distress. She is sitting in the living room with her daughter.  She is doing well there is no JVD no swelling of lower extremities overall seems to be doing well  Diagnosis for this visit:   1. Essential hypertension   2. Hyperlipidemia LDL goal <100   3. Breathlessness on exertion      ASSESSMENT & PLAN:    1.  Hypertension she read to me blood pressure measurements that she check her blood pressures actually in the lower side systolic 10 5-1 18.  I asked her to cut down hydrochlorothiazide in half.  She been taking only 25 mg daily.  I in the future anticipated to maybe even completely discontinue hydrochlorothiazide and adding ACE inhibitor but if we can control her blood pressure with the present management that should be also perfectly fine. 2.  Hyperlipidemia we spent a great deal of time talking about need to start taking cholesterol-lowering medication.  She understands she agrees I start her with Lipitor 10 mg daily fasting lipid profile AST ALT will be checked in 6 weeks. 3.  Shortness of breath with exertion.  Unchanged.  Echocardiogram normal left ventricular ejection fraction, left ventricle hypertrophy.  Probably at least  partially responsible for her shortness of breath is diastolic dysfunction  [id number] Education: The signs and symptoms of COVID-19 were discussed with the patient and how to seek care for testing (follow up with PCP or arrange E-visit).  The importance of social distancing was discussed today.  Patient Risk:   After full review of this patients clinical status, I feel that they are at least moderate risk at this time.  Time:   Today, I have spent 17 minutes with the patient with telehealth technology discussing pt health issues.  I spent 5 minutes reviewing her chart before the visit.  Visit was finished at 9:00 in the morning.    Medication Adjustments/Labs and Tests Ordered: Current medicines are reviewed at length with the patient today.  Concerns regarding medicines are outlined above.  No orders of the defined types were placed in this  encounter.  Medication changes: No orders of the defined types were placed in this encounter.    Disposition: 2 months follow-up, initiation of Lipitor with AST ALT and fasting lipid profile to follow.  Signed, Park Liter, MD, [facility] 06/25/2018 9:02 AM    [place]

## 2018-07-28 ENCOUNTER — Other Ambulatory Visit: Payer: Self-pay | Admitting: Family Medicine

## 2018-07-28 DIAGNOSIS — I10 Essential (primary) hypertension: Secondary | ICD-10-CM

## 2018-08-01 DIAGNOSIS — H3509 Other intraretinal microvascular abnormalities: Secondary | ICD-10-CM | POA: Diagnosis not present

## 2018-08-27 ENCOUNTER — Encounter: Payer: Self-pay | Admitting: Cardiology

## 2018-08-27 ENCOUNTER — Telehealth (INDEPENDENT_AMBULATORY_CARE_PROVIDER_SITE_OTHER): Payer: BC Managed Care – PPO | Admitting: Cardiology

## 2018-08-27 ENCOUNTER — Other Ambulatory Visit: Payer: Self-pay

## 2018-08-27 VITALS — BP 117/76 | HR 74 | Wt 152.3 lb

## 2018-08-27 DIAGNOSIS — I10 Essential (primary) hypertension: Secondary | ICD-10-CM | POA: Diagnosis not present

## 2018-08-27 DIAGNOSIS — R0681 Apnea, not elsewhere classified: Secondary | ICD-10-CM

## 2018-08-27 DIAGNOSIS — E785 Hyperlipidemia, unspecified: Secondary | ICD-10-CM

## 2018-08-27 NOTE — Progress Notes (Signed)
Virtual Visit via Video Note   This visit type was conducted due to national recommendations for restrictions regarding the COVID-19 Pandemic (e.g. social distancing) in an effort to limit this patient's exposure and mitigate transmission in our community.  Due to her co-morbid illnesses, this patient is at least at moderate risk for complications without adequate follow up.  This format is felt to be most appropriate for this patient at this time.  All issues noted in this document were discussed and addressed.  A limited physical exam was performed with this format.  Please refer to the patient's chart for her consent to telehealth for [facility].  Evaluation Performed:  Follow-up visit  This visit type was conducted due to national recommendations for restrictions regarding the COVID-19 Pandemic (e.g. social distancing).  This format is felt to be most appropriate for this patient at this time.  All issues noted in this document were discussed and addressed.  No physical exam was performed (except for noted visual exam findings with Video Visits).  Please refer to the patient's chart (MyChart message for video visits and phone note for telephone visits) for the patient's consent to telehealth for [facility].  Date:  08/27/2018  ID: Amy Zimmerman, DOB 1950/11/01, MRN [id number]   Patient Location[place] [address]   Provider location:   [place] Office  PCP:  Carollee Herter, Alferd Apa, DO  Cardiologist:  Jenne Campus, MD     Chief Complaint: Doing very well  History of Present Illness:    Amy Zimmerman is a 68 y.o. female  who presents via audio/video conferencing for a telehealth visit today.  With essential hypertension, dyslipidemia, shortness of breath overall doing very well blood pressure is excellently controlled we will continue.  Denies have any chest pain tightness squeezing pressure burning chest she admits that she does not  exercise on a regular basis but she is trying to be more active right now.   The patient does not have symptoms concerning for COVID-19 infection (fever, chills, cough, or new SHORTNESS OF BREATH).    Prior CV studies:   The following studies were reviewed today:       Past Medical History:  Diagnosis Date   Hypertension     Past Surgical History:  Procedure Laterality Date   COLONOSCOPY     MOHS SURGERY     Face and Back   uterine polyps       Current Meds  Medication Sig   atorvastatin (LIPITOR) 10 MG tablet Take 1 tablet (10 mg total) by mouth daily.   calcium carbonate (OS-CAL) 600 MG TABS Take 600 mg by mouth 2 (two) times daily with a meal.     hydrochlorothiazide (HYDRODIURIL) 50 MG tablet Take 0.5 tablets (25 mg total) by mouth daily.   metoprolol succinate (TOPROL-XL) 100 MG 24 hr tablet TAKE 1 TABLET BY MOUTH EVERY DAY TAKE WITH OR IMMEDIATELY FOLLOWING A MEAL. *DUE FOR OFFICE VISIT*   Omega-3 Fatty Acids (OMEGA-3 FISH OIL) 500 MG CAPS Take 1 capsule by mouth 2 (two) times daily.      Family History: The patient's family history includes Heart disease in her mother; Heart disease (age of onset: 3) in her father; Hyperlipidemia in her sister and sister; Hypertension in her mother; Kidney disease in her mother. There is no history of Colon cancer or Stomach cancer.   ROS:   Please see the history of present illness.  All other systems reviewed and are negative.   Labs/Other Tests and Data Reviewed:     Recent Labs: 05/08/2018: Hemoglobin 14.7; Magnesium 1.7; Platelets 281 05/22/2018: ALT 22; BUN 11; Creatinine, Ser 0.75; Potassium 3.7; Sodium 135  Recent Lipid Panel    Component Value Date/Time   CHOL 223 (H) 05/22/2018 0722   TRIG 155.0 (H) 05/22/2018 0722   HDL 51.00 05/22/2018 0722   CHOLHDL 4 05/22/2018 0722   VLDL 31.0 05/22/2018 0722   LDLCALC 141 (H) 05/22/2018 0722   LDLDIRECT 159.9 11/21/2012 0850      Exam:    Vital  Signs:  BP 117/76    Pulse 74    Wt 152 lb 4.8 oz (69.1 kg)    LMP  (LMP Unknown)    BMI 27.41 kg/m     Wt Readings from Last 3 Encounters:  08/27/18 152 lb 4.8 oz (69.1 kg)  05/16/18 158 lb 6.4 oz (71.8 kg)  05/08/18 160 lb (72.6 kg)     Well nourished, well developed in no acute distress. Alert awake oriented x3 talking to me through the video link.  Happy without any distress.  Diagnosis for this visit:   1. Essential hypertension   2. Dyslipidemia   3. Breathlessness on exertion      ASSESSMENT & PLAN:    1.  Essential hypertension blood pressure well controlled continue present management. 2.  Dyslipidemia Lipitor started will asked her to have fasting lipid profile redone. 3.  Dyspnea on exertion denies having any  We talked about healthy lifestyle I encouraged her to exercise on a regular basis.  COVID-19 Education: The signs and symptoms of COVID-19 were discussed with the patient and how to seek care for testing (follow up with PCP or arrange E-visit).  The importance of social distancing was discussed today.  Patient Risk:   After full review of this patients clinical status, I feel that they are at least moderate risk at this time.  Time:   Today, I have spent 15 minutes with the patient with telehealth technology discussing pt health issues.  I spent 5 minutes reviewing her chart before the visit.  Visit was finished at 9:25 AM.    Medication Adjustments/Labs and Tests Ordered: Current medicines are reviewed at length with the patient today.  Concerns regarding medicines are outlined above.  No orders of the defined types were placed in this encounter.  Medication changes: No orders of the defined types were placed in this encounter.    Disposition: Follow-up 6 months fasting lipid profile will be done  Signed, Park Liter, MD, [facility] 08/27/2018 9:26 AM    [place]

## 2018-08-27 NOTE — Addendum Note (Signed)
Addended by: Ashok Norris on: 08/27/2018 09:33 AM   Modules accepted: Orders

## 2018-08-27 NOTE — Patient Instructions (Signed)
Medication Instructions:  Your physician recommends that you continue on your current medications as directed. Please refer to the Current Medication list given to you today.  If you need a refill on your cardiac medications before your next appointment, please call your pharmacy.   Lab work: Your physician recommends that you return for lab work in 2weeks fasting: lipids, and bmp   If you have labs (blood work) drawn today and your tests are completely normal, you will receive your results only by: Marland Kitchen MyChart Message (if you have MyChart) OR . A paper copy in the mail If you have any lab test that is abnormal or we need to change your treatment, we will call you to review the results.  Testing/Procedures: None.   Follow-Up: At [facility], you and your health needs are our priority.  As part of our continuing mission to provide you with exceptional heart care, we have created designated Provider Care Teams.  These Care Teams include your primary Cardiologist (physician) and Advanced Practice Providers (APPs -  Physician Assistants and Nurse Practitioners) who all work together to provide you with the care you need, when you need it. You will need a follow up appointment in 6 months.  Please call our office 2 months in advance to schedule this appointment.  You may see No primary care provider on file. or another member of our [employer] Provider Team in [place]: Shirlee More, MD . Jyl Heinz, MD  Any Other Special Instructions Will Be Listed Below (If Applicable).

## 2018-09-05 DIAGNOSIS — I10 Essential (primary) hypertension: Secondary | ICD-10-CM | POA: Diagnosis not present

## 2018-09-05 DIAGNOSIS — E785 Hyperlipidemia, unspecified: Secondary | ICD-10-CM | POA: Diagnosis not present

## 2018-09-06 LAB — BASIC METABOLIC PANEL
BUN/Creatinine Ratio: 12 (ref 12–28)
BUN: 11 mg/dL (ref 8–27)
CO2: 25 mmol/L (ref 20–29)
Calcium: 10.1 mg/dL (ref 8.7–10.3)
Chloride: 97 mmol/L (ref 96–106)
Creatinine, Ser: 0.9 mg/dL (ref 0.57–1.00)
GFR calc Af Amer: 77 mL/min/{1.73_m2} (ref >59)
GFR calc non Af Amer: 67 mL/min/{1.73_m2} (ref >59)
Glucose: 101 mg/dL — ABNORMAL HIGH (ref 65–99)
Potassium: 4.4 mmol/L (ref 3.5–5.2)
Sodium: 141 mmol/L (ref 134–144)

## 2018-09-06 LAB — LIPID PANEL
Chol/HDL Ratio: 2.8 ratio (ref 0.0–4.4)
Cholesterol, Total: 146 mg/dL (ref 100–199)
HDL: 52 mg/dL (ref >39)
LDL Calculated: 73 mg/dL (ref 0–99)
Triglycerides: 103 mg/dL (ref 0–149)
VLDL Cholesterol Cal: 21 mg/dL (ref 5–40)

## 2018-09-07 ENCOUNTER — Other Ambulatory Visit: Payer: Self-pay | Admitting: Family Medicine

## 2018-09-07 DIAGNOSIS — I10 Essential (primary) hypertension: Secondary | ICD-10-CM

## 2018-10-28 ENCOUNTER — Other Ambulatory Visit: Payer: Self-pay | Admitting: Family Medicine

## 2018-10-28 DIAGNOSIS — I10 Essential (primary) hypertension: Secondary | ICD-10-CM

## 2018-11-06 DIAGNOSIS — D2271 Melanocytic nevi of right lower limb, including hip: Secondary | ICD-10-CM | POA: Diagnosis not present

## 2018-11-06 DIAGNOSIS — L821 Other seborrheic keratosis: Secondary | ICD-10-CM | POA: Diagnosis not present

## 2018-11-06 DIAGNOSIS — L814 Other melanin hyperpigmentation: Secondary | ICD-10-CM | POA: Diagnosis not present

## 2018-11-06 DIAGNOSIS — D225 Melanocytic nevi of trunk: Secondary | ICD-10-CM | POA: Diagnosis not present

## 2018-12-19 ENCOUNTER — Other Ambulatory Visit: Payer: Self-pay | Admitting: Cardiology

## 2018-12-19 DIAGNOSIS — I10 Essential (primary) hypertension: Secondary | ICD-10-CM

## 2019-02-25 ENCOUNTER — Other Ambulatory Visit: Payer: Self-pay | Admitting: Family Medicine

## 2019-02-25 DIAGNOSIS — I10 Essential (primary) hypertension: Secondary | ICD-10-CM

## 2019-03-04 ENCOUNTER — Encounter: Payer: Self-pay | Admitting: Cardiology

## 2019-03-04 ENCOUNTER — Ambulatory Visit (INDEPENDENT_AMBULATORY_CARE_PROVIDER_SITE_OTHER): Payer: BC Managed Care – PPO | Admitting: Cardiology

## 2019-03-04 ENCOUNTER — Other Ambulatory Visit: Payer: Self-pay

## 2019-03-04 VITALS — BP 132/84 | HR 83 | Ht 62.5 in | Wt 152.0 lb

## 2019-03-04 DIAGNOSIS — I10 Essential (primary) hypertension: Secondary | ICD-10-CM | POA: Diagnosis not present

## 2019-03-04 DIAGNOSIS — R0681 Apnea, not elsewhere classified: Secondary | ICD-10-CM | POA: Diagnosis not present

## 2019-03-04 DIAGNOSIS — E785 Hyperlipidemia, unspecified: Secondary | ICD-10-CM

## 2019-03-04 NOTE — Progress Notes (Signed)
Cardiology Office Note:    Date:  03/04/2019   ID:  Amy Zimmerman, DOB 1950-05-23, MRN [id number]  PCP:  Amy Zimmerman, Amy Apa, DO  Cardiologist:  Amy Campus, MD    Referring MD: Amy Zimmerman, Amy Zimmerman, *   Chief Complaint  Patient presents with  . Follow-up    6 MO FU  Doing well with that she is  History of Present Illness:    Amy Zimmerman is a 68 y.o. female review of essential hypertension, shortness of breath, dyslipidemia.  Comes today 2 months for follow-up.  Overall doing great asymptomatic walk every single day almost about 2 miles with no difficulties.  Does have some exertional shortness of breath as before.  But overall doing well.  Past Medical History:  Diagnosis Date  . Hypertension     Past Surgical History:  Procedure Laterality Date  . COLONOSCOPY    . MOHS SURGERY     Face and Back  . uterine polyps      Current Medications: Current Meds  Medication Sig  . calcium carbonate (OS-CAL) 600 MG TABS Take 600 mg by mouth 2 (two) times daily with a meal.    . hydrochlorothiazide (HYDRODIURIL) 50 MG tablet TAKE 1/2 TABLET BY MOUTH DAILY  . metoprolol succinate (TOPROL-XL) 100 MG 24 hr tablet TAKE 1 TABLET BY MOUTH EVERY DAY TAKE WITH OR IMMEDIATELY FOLLOWING A MEAL. *DUE FOR OFFICE VISIT*  . Omega-3 Fatty Acids (OMEGA-3 FISH OIL) 500 MG CAPS Take 1 capsule by mouth 2 (two) times daily.     Allergies:   Patient has no known allergies.   Social History   Socioeconomic History  . Marital status: Widowed    Spouse name: Not on file  . Number of children: Not on file  . Years of education: Not on file  . Highest education level: Not on file  Occupational History  . Occupation: Barrister's clerk  Tobacco Use  . Smoking status: Never Smoker  . Smokeless tobacco: Never Used  Substance and Sexual Activity  . Alcohol use: No  . Drug use: No  . Sexual activity: Yes    Partners: Male  Other Topics Concern  . Not on file  Social  History Narrative   Exercise- no   Social Determinants of Health   Financial Resource Strain:   . Difficulty of Paying Living Expenses: Not on file  Food Insecurity:   . Worried About Charity fundraiser in the Last Year: Not on file  . Ran Out of Food in the Last Year: Not on file  Transportation Needs:   . Lack of Transportation (Medical): Not on file  . Lack of Transportation (Non-Medical): Not on file  Physical Activity:   . Days of Exercise per Week: Not on file  . Minutes of Exercise per Session: Not on file  Stress:   . Feeling of Stress : Not on file  Social Connections:   . Frequency of Communication with Friends and Family: Not on file  . Frequency of Social Gatherings with Friends and Family: Not on file  . Attends Religious Services: Not on file  . Active Member of Clubs or Organizations: Not on file  . Attends Archivist Meetings: Not on file  . Marital Status: Not on file     Family History: The patient's family history includes Heart disease in her mother; Heart disease (age of onset: 16) in her father; Hyperlipidemia in her sister and sister; Hypertension in  her mother; Kidney disease in her mother. There is no history of Colon cancer or Stomach cancer. ROS:   Please see the history of present illness.    All 14 point review of systems negative except as described per history of present illness  EKGs/Labs/Other Studies Reviewed:      Recent Labs: 05/08/2018: Hemoglobin 14.7; Magnesium 1.7; Platelets 281 05/22/2018: ALT 22 09/05/2018: BUN 11; Creatinine, Ser 0.90; Potassium 4.4; Sodium 141  Recent Lipid Panel    Component Value Date/Time   CHOL 146 09/05/2018 0813   TRIG 103 09/05/2018 0813   HDL 52 09/05/2018 0813   CHOLHDL 2.8 09/05/2018 0813   CHOLHDL 4 05/22/2018 0722   VLDL 31.0 05/22/2018 0722   LDLCALC 73 09/05/2018 0813   LDLDIRECT 159.9 11/21/2012 0850    Physical Exam:    VS:  BP 132/84   Pulse 83   Ht 5' 2.5" (1.588 m)   Wt 152  lb (68.9 kg)   LMP  (LMP Unknown)   SpO2 97%   BMI 27.36 kg/m     Wt Readings from Last 3 Encounters:  03/04/19 152 lb (68.9 kg)  08/27/18 152 lb 4.8 oz (69.1 kg)  05/16/18 158 lb 6.4 oz (71.8 kg)     GEN:  Well nourished, well developed in no acute distress HEENT: Normal NECK: No JVD; No carotid bruits LYMPHATICS: No lymphadenopathy CARDIAC: RRR, no murmurs, no rubs, no gallops RESPIRATORY:  Clear to auscultation without rales, wheezing or rhonchi  ABDOMEN: Soft, non-tender, non-distended MUSCULOSKELETAL:  No edema; No deformity  SKIN: Warm and dry LOWER EXTREMITIES: no swelling NEUROLOGIC:  Alert and oriented x 3 PSYCHIATRIC:  Normal affect   ASSESSMENT:    1. Essential hypertension   2. Hyperlipidemia LDL goal <100   3. Breathlessness on exertion    PLAN:    In order of problems listed above:  1. Essential hypertension blood pressure well controlled we will continue present management. 2. Dyslipidemia last fasting lipid profile is excellent with LDL 73 and HDL 52 we will continue present management which include atorvastatin 10 mg daily 3. Dyspnea on exertion same prior evaluation negative.  We will continue present management.   Medication Adjustments/Labs and Tests Ordered: Current medicines are reviewed at length with the patient today.  Concerns regarding medicines are outlined above.  No orders of the defined types were placed in this encounter.  Medication changes: No orders of the defined types were placed in this encounter.   Signed, Amy Liter, MD, [facility] 03/04/2019 8:44 AM    [place]

## 2019-03-04 NOTE — Patient Instructions (Signed)
Medication Instructions:  Your physician recommends that you continue on your current medications as directed. Please refer to the Current Medication list given to you today.   *If you need a refill on your cardiac medications before your next appointment, please call your pharmacy*  Lab Work: None ordered  If you have labs (blood work) drawn today and your tests are completely normal, you will receive your results only by: Marland Kitchen MyChart Message (if you have MyChart) OR . A paper copy in the mail If you have any lab test that is abnormal or we need to change your treatment, we will call you to review the results.  Testing/Procedures: None   Follow-Up: At [facility], you and your health needs are our priority.  As part of our continuing mission to provide you with exceptional heart care, we have created designated Provider Care Teams.  These Care Teams include your primary Cardiologist (physician) and Advanced Practice Providers (APPs -  Physician Assistants and Nurse Practitioners) who all work together to provide you with the care you need, when you need it.  Your next appointment:   12 month(s)  The format for your next appointment:   In Person  Provider:   Jenne Campus, MD  Other Instructions None

## 2019-03-24 ENCOUNTER — Other Ambulatory Visit: Payer: Self-pay

## 2019-03-25 ENCOUNTER — Other Ambulatory Visit: Payer: Self-pay

## 2019-03-25 ENCOUNTER — Encounter: Payer: Self-pay | Admitting: Family Medicine

## 2019-03-25 ENCOUNTER — Ambulatory Visit (INDEPENDENT_AMBULATORY_CARE_PROVIDER_SITE_OTHER): Payer: BC Managed Care – PPO | Admitting: Family Medicine

## 2019-03-25 VITALS — BP 150/82 | HR 78 | Temp 97.9°F | Resp 18 | Ht 62.5 in | Wt 152.8 lb

## 2019-03-25 DIAGNOSIS — M170 Bilateral primary osteoarthritis of knee: Secondary | ICD-10-CM

## 2019-03-25 DIAGNOSIS — E785 Hyperlipidemia, unspecified: Secondary | ICD-10-CM

## 2019-03-25 DIAGNOSIS — E039 Hypothyroidism, unspecified: Secondary | ICD-10-CM

## 2019-03-25 DIAGNOSIS — C4330 Malignant melanoma of unspecified part of face: Secondary | ICD-10-CM

## 2019-03-25 DIAGNOSIS — E2839 Other primary ovarian failure: Secondary | ICD-10-CM | POA: Diagnosis not present

## 2019-03-25 DIAGNOSIS — I10 Essential (primary) hypertension: Secondary | ICD-10-CM

## 2019-03-25 DIAGNOSIS — Z0000 Encounter for general adult medical examination without abnormal findings: Secondary | ICD-10-CM | POA: Diagnosis not present

## 2019-03-25 DIAGNOSIS — Z23 Encounter for immunization: Secondary | ICD-10-CM | POA: Diagnosis not present

## 2019-03-25 DIAGNOSIS — Z86010 Personal history of colonic polyps: Secondary | ICD-10-CM

## 2019-03-25 LAB — COMPREHENSIVE METABOLIC PANEL
ALT: 24 U/L (ref 0–35)
AST: 19 U/L (ref 0–37)
Albumin: 4.4 g/dL (ref 3.5–5.2)
Alkaline Phosphatase: 80 U/L (ref 39–117)
BUN: 14 mg/dL (ref 6–23)
CO2: 32 mEq/L (ref 19–32)
Calcium: 10 mg/dL (ref 8.4–10.5)
Chloride: 99 mEq/L (ref 96–112)
Creatinine, Ser: 0.81 mg/dL (ref 0.40–1.20)
GFR: 70.51 mL/min (ref >60.00)
Glucose, Bld: 87 mg/dL (ref 70–99)
Potassium: 3.9 mEq/L (ref 3.5–5.1)
Sodium: 140 mEq/L (ref 135–145)
Total Bilirubin: 0.5 mg/dL (ref 0.2–1.2)
Total Protein: 7.2 g/dL (ref 6.0–8.3)

## 2019-03-25 LAB — LIPID PANEL
Cholesterol: 171 mg/dL (ref 0–200)
HDL: 57.1 mg/dL (ref >39.00)
LDL Cholesterol: 88 mg/dL (ref 0–99)
NonHDL: 113.83
Total CHOL/HDL Ratio: 3
Triglycerides: 129 mg/dL (ref 0.0–149.0)
VLDL: 25.8 mg/dL (ref 0.0–40.0)

## 2019-03-25 LAB — TSH: TSH: 1.86 u[IU]/mL (ref 0.35–4.50)

## 2019-03-25 MED ORDER — DICLOFENAC SODIUM 1 % EX GEL: 4.0000 g | Freq: Four times a day (QID) | CUTANEOUS | 3 refills | Status: DC

## 2019-03-25 NOTE — Progress Notes (Signed)
Amy Zimmerman is a 69 y.o. female and is here for a comprehensive physical exam. The patient reports no problems.  Social History   Socioeconomic History  . Marital status: Widowed    Spouse name: Not on file  . Number of children: Not on file  . Years of education: Not on file  . Highest education level: Not on file  Occupational History  . Occupation: Barrister's clerk  Tobacco Use  . Smoking status: Never Smoker  . Smokeless tobacco: Never Used  Substance and Sexual Activity  . Alcohol use: No  . Drug use: No  . Sexual activity: Yes    Partners: Male  Other Topics Concern  . Not on file  Social History Narrative   Exercise- no   Social Determinants of Health   Financial Resource Strain:   . Difficulty of Paying Living Expenses: Not on file  Food Insecurity:   . Worried About Charity fundraiser in the Last Year: Not on file  . Ran Out of Food in the Last Year: Not on file  Transportation Needs:   . Lack of Transportation (Medical): Not on file  . Lack of Transportation (Non-Medical): Not on file  Physical Activity:   . Days of Exercise per Week: Not on file  . Minutes of Exercise per Session: Not on file  Stress:   . Feeling of Stress : Not on file  Social Connections:   . Frequency of Communication with Friends and Family: Not on file  . Frequency of Social Gatherings with Friends and Family: Not on file  . Attends Religious Services: Not on file  . Active Member of Clubs or Organizations: Not on file  . Attends Archivist Meetings: Not on file  . Marital Status: Not on file  Intimate Partner Violence:   . Fear of Current or Ex-Partner: Not on file  . Emotionally Abused: Not on file  . Physically Abused: Not on file  . Sexually Abused: Not on file   Health  Maintenance  Topic Date Due  . DEXA SCAN  08/13/2015  . COLONOSCOPY  04/16/2016  . MAMMOGRAM  10/17/2016  . PNA vac Low Risk Adult (1 of 2 - PCV13) 05/02/2023 (Originally 02/27/2017)  . INFLUENZA VACCINE  01/02/2024 (Originally 10/05/2018)  . TETANUS/TDAP  03/08/2021  . Hepatitis C Screening  Completed    The following portions of the patient's history were reviewed and updated as appropriate:  She  has a past medical history of Hypertension. She does not have any  pertinent problems on file. She  has a past surgical history that includes uterine polyps; Colonoscopy; and Mohs surgery. Her family history includes Heart disease in her mother; Heart disease (age of onset: 69) in her father; Hyperlipidemia in her sister and sister; Hypertension in her mother; Kidney disease in her mother. She  reports that she has never smoked. She has never used smokeless tobacco. She reports that she does not drink alcohol or use drugs. She has a current medication list which includes the following prescription(s): calcium carbonate, hydrochlorothiazide, metoprolol succinate, omega-3 fish oil, atorvastatin, and diclofenac sodium. Current Outpatient Medications on File Prior to Visit  Medication Sig Dispense Refill  . calcium carbonate (OS-CAL) 600 MG TABS Take 600 mg by mouth 2 (two) times daily with a meal.      . hydrochlorothiazide (HYDRODIURIL) 50 MG tablet TAKE 1/2 TABLET BY MOUTH DAILY 45 tablet 1  . metoprolol succinate (TOPROL-XL) 100 MG 24 hr tablet TAKE 1 TABLET BY MOUTH EVERY DAY TAKE WITH OR IMMEDIATELY FOLLOWING A MEAL. *DUE FOR OFFICE VISIT* 30 tablet 0  . Omega-3 Fatty Acids (OMEGA-3 FISH OIL) 500 MG CAPS Take 1 capsule by mouth 2 (two) times daily.    Marland Kitchen atorvastatin (LIPITOR) 10 MG tablet Take 1 tablet (10 mg total) by mouth daily. 90 tablet 3   No current facility-administered medications on file prior to visit.   She has No Known Allergies..  Review of Systems Review of Systems   Constitutional: Negative for activity change, appetite change and fatigue.  HENT: Negative for hearing loss, congestion, tinnitus and ear discharge.  dentist q64m Eyes: Negative for visual disturbance (see optho q1y -- vision corrected to 20/20 with glasses).  Respiratory: Negative for cough, chest tightness and shortness of breath.   Cardiovascular: Negative for chest pain, palpitations and leg swelling.  Gastrointestinal: Negative for abdominal pain, diarrhea, constipation and abdominal distention.  Genitourinary: Negative for urgency, frequency, decreased urine volume and difficulty urinating.  Musculoskeletal: Negative for back pain, arthralgias and gait problem.  Skin: Negative for color change, pallor and rash.  Neurological: Negative for dizziness, light-headedness, numbness and headaches.  Hematological: Negative for adenopathy. Does not bruise/bleed easily.  Psychiatric/Behavioral: Negative for suicidal ideas, confusion, sleep disturbance, self-injury, dysphoric mood, decreased concentration and agitation.       Objective:    BP (!) 150/82 (BP Location: Right Arm, Patient Position: Sitting, Cuff Size: Normal)   Pulse 78   Temp 97.9 F (36.6 C) (Temporal)   Resp 18   Ht 5' 2.5" (1.588 m)   Wt 152 lb 12.8 oz (69.3 kg)   LMP  (LMP Unknown)   SpO2 97%   BMI 27.50 kg/m  General appearance: alert, cooperative, appears stated age and no distress Head: Normocephalic, without obvious abnormality, atraumatic Eyes: conjunctivae/corneas clear. PERRL, EOM's intact. Fundi benign. Ears: normal TM's and external ear canals both ears Nose: Nares normal. Septum midline. Mucosa normal. No drainage or sinus tenderness. Throat: lips, mucosa, and tongue normal; teeth and gums normal Neck: no adenopathy, no carotid bruit, no JVD, supple, symmetrical, trachea midline and thyroid not enlarged, symmetric, no tenderness/mass/nodules Back: symmetric, no curvature. ROM normal. No CVA  tenderness. Lungs: clear to auscultation bilaterally Breasts: normal appearance, no masses or tenderness Heart: regular rate and rhythm, S1, S2 normal, no murmur, click, rub or gallop Abdomen: soft, non-tender; bowel sounds normal; no masses,  no organomegaly Pelvic: not indicated; post-menopausal, no abnormal Pap smears in past Extremities: extremities normal, atraumatic, no cyanosis or edema Pulses: 2+  and symmetric Skin: Skin color, texture, turgor normal. No rashes or lesions Lymph nodes: Cervical, supraclavicular, and axillary nodes normal. Neurologic: Alert and oriented X 3, normal strength and tone. Normal symmetric reflexes. Normal coordination and gait    Assessment:    Healthy female exam.      Plan:    ghm utd Check labs See After Visit Summary for Counseling Recommendations    1. Hypothyroidism, unspecified type con't meds  Check labs  - TSH  2. Hyperlipidemia, unspecified hyperlipidemia type Encouraged heart healthy diet, increase exercise, avoid trans fats, consider a krill oil cap daily - Lipid panel - Comprehensive metabolic panel  3. Estrogen deficiency  - DG Bone Density; Future  4. History of colon polyps  - Ambulatory referral to Gastroenterology  5. Primary osteoarthritis of both knees  - diclofenac Sodium (VOLTAREN) 1 % GEL; Apply 4 g topically 4 (four) times daily.  Dispense: 100 g; Refill: 3  6. Need for pneumococcal vaccination - Pneumococcal conjugate vaccine 13-valent IM  7. Preventative health care See above   8. Essential hypertension Well controlled, no changes to meds. Encouraged heart healthy diet such as the DASH diet and exercise as tolerated.   9. Malignant melanoma of face excluding eyelid, nose, lip, and ear (HCC) F/u derm

## 2019-03-25 NOTE — Patient Instructions (Signed)
COVID-19 Vaccine Information can be found at: [email or web address] For questions related to vaccine distribution or appointments, please email [email or web address] or call [phone].    Preventive Care 68 Years and Older, Female Preventive care refers to lifestyle choices and visits with your health care provider that can promote health and wellness. This includes:  A yearly physical exam. This is also called an annual well check.  Regular dental and eye exams.  Immunizations.  Screening for certain conditions.  Healthy lifestyle choices, such as diet and exercise. What can I expect for my preventive care visit? Physical exam Your health care provider will check:  Height and weight. These may be used to calculate body mass index (BMI), which is a measurement that tells if you are at a healthy weight.  Heart rate and blood pressure.  Your skin for abnormal spots. Counseling Your health care provider may ask you questions about:  Alcohol, tobacco, and drug use.  Emotional well-being.  Home and relationship well-being.  Sexual activity.  Eating habits.  History of falls.  Memory and ability to understand (cognition).  Work and work Statistician.  Pregnancy and menstrual history. What immunizations do I need?  Influenza (flu) vaccine  This is recommended every year. Tetanus, diphtheria, and pertussis (Tdap) vaccine  You may need a Td booster every 10 years. Varicella (chickenpox) vaccine  You may need this vaccine if you have not already been vaccinated. Zoster (shingles) vaccine  You may need this after age 68. Pneumococcal conjugate (PCV13) vaccine  One dose is recommended after age 68. Pneumococcal polysaccharide (PPSV23) vaccine  One dose is recommended after age 68. Measles, mumps, and rubella (MMR) vaccine  You may need at least one dose of MMR if you were born in 1957 or later. You may  also need a second dose. Meningococcal conjugate (MenACWY) vaccine  You may need this if you have certain conditions. Hepatitis A vaccine  You may need this if you have certain conditions or if you travel or work in places where you may be exposed to hepatitis A. Hepatitis B vaccine  You may need this if you have certain conditions or if you travel or work in places where you may be exposed to hepatitis B. Haemophilus influenzae type b (Hib) vaccine  You may need this if you have certain conditions. You may receive vaccines as individual doses or as more than one vaccine together in one shot (combination vaccines). Talk with your health care provider about the risks and benefits of combination vaccines. What tests do I need? Blood tests  Lipid and cholesterol levels. These may be checked every 5 years, or more frequently depending on your overall health.  Hepatitis C test.  Hepatitis B test. Screening  Lung cancer screening. You may have this screening every year starting at age 68 if you have a 30-pack-year history of smoking and currently smoke or have quit within the past 15 years.  Colorectal cancer screening. All adults should have this screening starting at age 68 and continuing until age 68. Your health care provider may recommend screening at age 68 if you are at increased risk. You will have tests every 1-10 years, depending on your results and the type of screening test.  Diabetes screening. This is done by checking your blood sugar (glucose) after you have not eaten for a while (fasting). You may have this done every 1-3 years.  Mammogram. This may be done every 1-2 years. Talk with your health care provider about how often you  how often you should have regular mammograms.  BRCA-related cancer screening. This may be done if you have a family history of breast, ovarian, tubal, or peritoneal cancers. Other tests  Sexually transmitted disease (STD) testing.  Bone density scan. This  is done to screen for osteoporosis. You may have this done starting at age 68. Follow these instructions at home: Eating and drinking  Eat a diet that includes fresh fruits and vegetables, whole grains, lean protein, and low-fat dairy products. Limit your intake of foods with high amounts of sugar, saturated fats, and salt.  Take vitamin and mineral supplements as recommended by your health care provider.  Do not drink alcohol if your health care provider tells you not to drink.  If you drink alcohol: ? Limit how much you have to 0-1 drink a day. ? Be aware of how much alcohol is in your drink. In the [place] one drink equals one 12 oz bottle of beer (355 mL), one 5 oz glass of wine (148 mL), or one 1 oz glass of hard liquor (44 mL). Lifestyle  Take daily care of your teeth and gums.  Stay active. Exercise for at least 30 minutes on 5 or more days each week.  Do not use any products that contain nicotine or tobacco, such as cigarettes, e-cigarettes, and chewing tobacco. If you need help quitting, ask your health care provider.  If you are sexually active, practice safe sex. Use a condom or other form of protection in order to prevent STIs (sexually transmitted infections).  Talk with your health care provider about taking a low-dose aspirin or statin. What's next?  Go to your health care provider once a year for a well check visit.  Ask your health care provider how often you should have your eyes and teeth checked.  Stay up to date on all vaccines. This information is not intended to replace advice given to you by your health care provider. Make sure you discuss any questions you have with your health care provider. Document Revised: 02/14/2018 Document Reviewed: 02/14/2018 Elsevier Patient Education  2020 Elsevier Inc.  

## 2019-03-26 ENCOUNTER — Encounter: Payer: Self-pay | Admitting: Family Medicine

## 2019-03-26 DIAGNOSIS — E039 Hypothyroidism, unspecified: Secondary | ICD-10-CM | POA: Insufficient documentation

## 2019-03-26 DIAGNOSIS — E2839 Other primary ovarian failure: Secondary | ICD-10-CM | POA: Insufficient documentation

## 2019-03-26 DIAGNOSIS — Z0000 Encounter for general adult medical examination without abnormal findings: Secondary | ICD-10-CM | POA: Insufficient documentation

## 2019-03-26 DIAGNOSIS — Z86010 Personal history of colonic polyps: Secondary | ICD-10-CM | POA: Insufficient documentation

## 2019-03-26 DIAGNOSIS — M170 Bilateral primary osteoarthritis of knee: Secondary | ICD-10-CM | POA: Insufficient documentation

## 2019-03-26 DIAGNOSIS — Z23 Encounter for immunization: Secondary | ICD-10-CM | POA: Insufficient documentation

## 2019-03-26 NOTE — Assessment & Plan Note (Signed)
Check labs con't meds 

## 2019-03-26 NOTE — Assessment & Plan Note (Signed)
Tolerating statin, encouraged heart healthy diet, avoid trans fats, minimize simple carbs and saturated fats. Increase exercise as tolerated 

## 2019-03-26 NOTE — Assessment & Plan Note (Signed)
See above

## 2019-03-26 NOTE — Assessment & Plan Note (Signed)
Well controlled, no changes to meds. Encouraged heart healthy diet such as the DASH diet and exercise as tolerated.  °

## 2019-03-27 ENCOUNTER — Other Ambulatory Visit: Payer: Self-pay | Admitting: Family Medicine

## 2019-03-27 DIAGNOSIS — Z1231 Encounter for screening mammogram for malignant neoplasm of breast: Secondary | ICD-10-CM

## 2019-03-29 ENCOUNTER — Other Ambulatory Visit: Payer: Self-pay | Admitting: Family Medicine

## 2019-03-29 DIAGNOSIS — I10 Essential (primary) hypertension: Secondary | ICD-10-CM

## 2019-04-20 ENCOUNTER — Ambulatory Visit: Payer: BC Managed Care – PPO | Attending: Internal Medicine

## 2019-04-20 DIAGNOSIS — Z23 Encounter for immunization: Secondary | ICD-10-CM | POA: Insufficient documentation

## 2019-04-20 NOTE — Progress Notes (Signed)
   Covid-19 Vaccination Clinic  Name:  Amy Zimmerman    MRN: [id number] DOB: 04/25/1950  04/20/2019  [name] was observed post Covid-19 immunization for 15 minutes without incidence. She was provided with Vaccine Information Sheet and instruction to access the V-Safe system.   [name] was instructed to call 911 with any severe reactions post vaccine: Marland Kitchen Difficulty breathing  . Swelling of your face and throat  . A fast heartbeat  . A bad rash all over your body  . Dizziness and weakness    Immunizations Administered    Name Date Dose VIS Date Route   Pfizer COVID-19 Vaccine 04/20/2019  1:22 PM 0.3 mL 02/14/2019 Intramuscular   Manufacturer: [place]   Lot: [id number]   [place]: [id number]

## 2019-04-29 DIAGNOSIS — Z23 Encounter for immunization: Secondary | ICD-10-CM | POA: Diagnosis not present

## 2019-04-29 DIAGNOSIS — L814 Other melanin hyperpigmentation: Secondary | ICD-10-CM | POA: Diagnosis not present

## 2019-04-29 DIAGNOSIS — D2271 Melanocytic nevi of right lower limb, including hip: Secondary | ICD-10-CM | POA: Diagnosis not present

## 2019-04-29 DIAGNOSIS — L821 Other seborrheic keratosis: Secondary | ICD-10-CM | POA: Diagnosis not present

## 2019-04-29 DIAGNOSIS — D485 Neoplasm of uncertain behavior of skin: Secondary | ICD-10-CM | POA: Diagnosis not present

## 2019-04-29 DIAGNOSIS — D225 Melanocytic nevi of trunk: Secondary | ICD-10-CM | POA: Diagnosis not present

## 2019-05-06 ENCOUNTER — Ambulatory Visit: Payer: BC Managed Care – PPO

## 2019-05-13 ENCOUNTER — Ambulatory Visit: Payer: BC Managed Care – PPO | Attending: Internal Medicine

## 2019-05-13 DIAGNOSIS — Z23 Encounter for immunization: Secondary | ICD-10-CM | POA: Insufficient documentation

## 2019-05-13 NOTE — Progress Notes (Signed)
   Covid-19 Vaccination Clinic  Name:  Amy Zimmerman    MRN: [id number] DOB: May 22, 1950  05/13/2019  [name] was observed post Covid-19 immunization for 15 minutes without incident. She was provided with Vaccine Information Sheet and instruction to access the V-Safe system.   [name] was instructed to call 911 with any severe reactions post vaccine: Marland Kitchen Difficulty breathing  . Swelling of face and throat  . A fast heartbeat  . A bad rash all over body  . Dizziness and weakness   Immunizations Administered    Name Date Dose VIS Date Route   Pfizer COVID-19 Vaccine 05/13/2019  9:37 AM 0.3 mL 02/14/2019 Intramuscular   Manufacturer: [place]   Lot: [id number]   [place]: [id number]

## 2019-05-14 ENCOUNTER — Ambulatory Visit: Payer: BC Managed Care – PPO

## 2019-06-11 ENCOUNTER — Other Ambulatory Visit: Payer: BC Managed Care – PPO

## 2019-06-12 ENCOUNTER — Other Ambulatory Visit: Payer: Self-pay | Admitting: Cardiology

## 2019-07-01 ENCOUNTER — Ambulatory Visit
Admission: RE | Admit: 2019-07-01 | Discharge: 2019-07-01 | Disposition: A | Discharge status: Home / Self Care | Payer: BC Managed Care – PPO | Source: Ambulatory Visit | Attending: Family Medicine | Admitting: Family Medicine

## 2019-07-01 ENCOUNTER — Other Ambulatory Visit: Payer: Self-pay

## 2019-07-01 DIAGNOSIS — Z1231 Encounter for screening mammogram for malignant neoplasm of breast: Secondary | ICD-10-CM | POA: Diagnosis not present

## 2019-07-01 DIAGNOSIS — E2839 Other primary ovarian failure: Secondary | ICD-10-CM

## 2019-07-01 DIAGNOSIS — Z780 Asymptomatic menopausal state: Secondary | ICD-10-CM | POA: Diagnosis not present

## 2019-07-01 DIAGNOSIS — M8589 Other specified disorders of bone density and structure, multiple sites: Secondary | ICD-10-CM | POA: Diagnosis not present

## 2019-09-19 ENCOUNTER — Other Ambulatory Visit: Payer: Self-pay | Admitting: Family Medicine

## 2019-09-19 DIAGNOSIS — I10 Essential (primary) hypertension: Secondary | ICD-10-CM

## 2019-10-02 ENCOUNTER — Ambulatory Visit (INDEPENDENT_AMBULATORY_CARE_PROVIDER_SITE_OTHER): Payer: BC Managed Care – PPO | Admitting: Family Medicine

## 2019-10-02 ENCOUNTER — Other Ambulatory Visit: Payer: Self-pay

## 2019-10-02 ENCOUNTER — Encounter: Payer: Self-pay | Admitting: Family Medicine

## 2019-10-02 VITALS — BP 140/86 | HR 84 | Temp 98.4°F | Resp 16 | Ht 62.5 in | Wt 143.0 lb

## 2019-10-02 DIAGNOSIS — I10 Essential (primary) hypertension: Secondary | ICD-10-CM | POA: Diagnosis not present

## 2019-10-02 DIAGNOSIS — E785 Hyperlipidemia, unspecified: Secondary | ICD-10-CM

## 2019-10-02 DIAGNOSIS — E039 Hypothyroidism, unspecified: Secondary | ICD-10-CM | POA: Diagnosis not present

## 2019-10-02 LAB — COMPREHENSIVE METABOLIC PANEL
ALT: 31 U/L (ref 0–35)
AST: 21 U/L (ref 0–37)
Albumin: 4.1 g/dL (ref 3.5–5.2)
Alkaline Phosphatase: 70 U/L (ref 39–117)
BUN: 18 mg/dL (ref 6–23)
CO2: 31 mEq/L (ref 19–32)
Calcium: 10.1 mg/dL (ref 8.4–10.5)
Chloride: 102 mEq/L (ref 96–112)
Creatinine, Ser: 0.76 mg/dL (ref 0.40–1.20)
GFR: 75.77 mL/min (ref >60.00)
Glucose, Bld: 103 mg/dL — ABNORMAL HIGH (ref 70–99)
Potassium: 4.3 mEq/L (ref 3.5–5.1)
Sodium: 138 mEq/L (ref 135–145)
Total Bilirubin: 0.8 mg/dL (ref 0.2–1.2)
Total Protein: 7.1 g/dL (ref 6.0–8.3)

## 2019-10-02 LAB — TSH: TSH: <0.01 u[IU]/mL — ABNORMAL LOW (ref 0.35–4.50)

## 2019-10-02 LAB — LIPID PANEL
Cholesterol: 138 mg/dL (ref 0–200)
HDL: 45.6 mg/dL (ref >39.00)
LDL Cholesterol: 70 mg/dL (ref 0–99)
NonHDL: 92.12
Total CHOL/HDL Ratio: 3
Triglycerides: 110 mg/dL (ref 0.0–149.0)
VLDL: 22 mg/dL (ref 0.0–40.0)

## 2019-10-02 NOTE — Assessment & Plan Note (Signed)
Tolerating statin, encouraged heart healthy diet, avoid trans fats, minimize simple carbs and saturated fats. Increase exercise as tolerated 

## 2019-10-02 NOTE — Patient Instructions (Signed)
DASH Eating Plan DASH stands for "Dietary Approaches to Stop Hypertension." The DASH eating plan is a healthy eating plan that has been shown to reduce high blood pressure (hypertension). It may also reduce your risk for type 2 diabetes, heart disease, and stroke. The DASH eating plan may also help with weight loss. What are tips for following this plan?  General guidelines  Avoid eating more than 2,300 mg (milligrams) of salt (sodium) a day. If you have hypertension, you may need to reduce your sodium intake to 1,500 mg a day.  Limit alcohol intake to no more than 1 drink a day for nonpregnant women and 2 drinks a day for men. One drink equals 12 oz of beer, 5 oz of wine, or 1 oz of hard liquor.  Work with your health care provider to maintain a healthy body weight or to lose weight. Ask what an ideal weight is for you.  Get at least 30 minutes of exercise that causes your heart to beat faster (aerobic exercise) most days of the week. Activities may include walking, swimming, or biking.  Work with your health care provider or diet and nutrition specialist (dietitian) to adjust your eating plan to your individual calorie needs. Reading food labels   Check food labels for the amount of sodium per serving. Choose foods with less than 5 percent of the Daily Value of sodium. Generally, foods with less than 300 mg of sodium per serving fit into this eating plan.  To find whole grains, look for the word "whole" as the first word in the ingredient list. Shopping  Buy products labeled as "low-sodium" or "no salt added."  Buy fresh foods. Avoid canned foods and premade or frozen meals. Cooking  Avoid adding salt when cooking. Use salt-free seasonings or herbs instead of table salt or sea salt. Check with your health care provider or pharmacist before using salt substitutes.  Do not fry foods. Cook foods using healthy methods such as baking, boiling, grilling, and broiling instead.  Cook with  heart-healthy oils, such as olive, canola, soybean, or sunflower oil. Meal planning  Eat a balanced diet that includes: ? 5 or more servings of fruits and vegetables each day. At each meal, try to fill half of your plate with fruits and vegetables. ? Up to 6-8 servings of whole grains each day. ? Less than 6 oz of lean meat, poultry, or fish each day. A 3-oz serving of meat is about the same size as a deck of cards. One egg equals 1 oz. ? 2 servings of low-fat dairy each day. ? A serving of nuts, seeds, or beans 5 times each week. ? Heart-healthy fats. Healthy fats called Omega-3 fatty acids are found in foods such as flaxseeds and coldwater fish, like sardines, salmon, and mackerel.  Limit how much you eat of the following: ? Canned or prepackaged foods. ? Food that is high in trans fat, such as fried foods. ? Food that is high in saturated fat, such as fatty meat. ? Sweets, desserts, sugary drinks, and other foods with added sugar. ? Full-fat dairy products.  Do not salt foods before eating.  Try to eat at least 2 vegetarian meals each week.  Eat more home-cooked food and less restaurant, buffet, and fast food.  When eating at a restaurant, ask that your food be prepared with less salt or no salt, if possible. What foods are recommended? The items listed may not be a complete list. Talk with your dietitian about   what dietary choices are best for you. Grains Whole-grain or whole-wheat bread. Whole-grain or whole-wheat pasta. Brown rice. Oatmeal. Quinoa. Bulgur. Whole-grain and low-sodium cereals. Pita bread. Low-fat, low-sodium crackers. Whole-wheat flour tortillas. Vegetables Fresh or frozen vegetables (raw, steamed, roasted, or grilled). Low-sodium or reduced-sodium tomato and vegetable juice. Low-sodium or reduced-sodium tomato sauce and tomato paste. Low-sodium or reduced-sodium canned vegetables. Fruits All fresh, dried, or frozen fruit. Canned fruit in natural juice (without  added sugar). Meat and other protein foods Skinless chicken or turkey. Ground chicken or turkey. Pork with fat trimmed off. Fish and seafood. Egg whites. Dried beans, peas, or lentils. Unsalted nuts, nut butters, and seeds. Unsalted canned beans. Lean cuts of beef with fat trimmed off. Low-sodium, lean deli meat. Dairy Low-fat (1%) or fat-free (skim) milk. Fat-free, low-fat, or reduced-fat cheeses. Nonfat, low-sodium ricotta or cottage cheese. Low-fat or nonfat yogurt. Low-fat, low-sodium cheese. Fats and oils Soft margarine without trans fats. Vegetable oil. Low-fat, reduced-fat, or light mayonnaise and salad dressings (reduced-sodium). Canola, safflower, olive, soybean, and sunflower oils. Avocado. Seasoning and other foods Herbs. Spices. Seasoning mixes without salt. Unsalted popcorn and pretzels. Fat-free sweets. What foods are not recommended? The items listed may not be a complete list. Talk with your dietitian about what dietary choices are best for you. Grains Baked goods made with fat, such as croissants, muffins, or some breads. Dry pasta or rice meal packs. Vegetables Creamed or fried vegetables. Vegetables in a cheese sauce. Regular canned vegetables (not low-sodium or reduced-sodium). Regular canned tomato sauce and paste (not low-sodium or reduced-sodium). Regular tomato and vegetable juice (not low-sodium or reduced-sodium). Pickles. Olives. Fruits Canned fruit in a light or heavy syrup. Fried fruit. Fruit in cream or butter sauce. Meat and other protein foods Fatty cuts of meat. Ribs. Fried meat. Bacon. Sausage. Bologna and other processed lunch meats. Salami. Fatback. Hotdogs. Bratwurst. Salted nuts and seeds. Canned beans with added salt. Canned or smoked fish. Whole eggs or egg yolks. Chicken or turkey with skin. Dairy Whole or 2% milk, cream, and half-and-half. Whole or full-fat cream cheese. Whole-fat or sweetened yogurt. Full-fat cheese. Nondairy creamers. Whipped toppings.  Processed cheese and cheese spreads. Fats and oils Butter. Stick margarine. Lard. Shortening. Ghee. Bacon fat. Tropical oils, such as coconut, palm kernel, or palm oil. Seasoning and other foods Salted popcorn and pretzels. Onion salt, garlic salt, seasoned salt, table salt, and sea salt. Worcestershire sauce. Tartar sauce. Barbecue sauce. Teriyaki sauce. Soy sauce, including reduced-sodium. Steak sauce. Canned and packaged gravies. Fish sauce. Oyster sauce. Cocktail sauce. Horseradish that you find on the shelf. Ketchup. Mustard. Meat flavorings and tenderizers. Bouillon cubes. Hot sauce and Tabasco sauce. Premade or packaged marinades. Premade or packaged taco seasonings. Relishes. Regular salad dressings. Where to find more information:  National Heart, Lung, and Blood Institute: www.nhlbi.nih.gov  American Heart Association: www.heart.org Summary  The DASH eating plan is a healthy eating plan that has been shown to reduce high blood pressure (hypertension). It may also reduce your risk for type 2 diabetes, heart disease, and stroke.  With the DASH eating plan, you should limit salt (sodium) intake to 2,300 mg a day. If you have hypertension, you may need to reduce your sodium intake to 1,500 mg a day.  When on the DASH eating plan, aim to eat more fresh fruits and vegetables, whole grains, lean proteins, low-fat dairy, and heart-healthy fats.  Work with your health care provider or diet and nutrition specialist (dietitian) to adjust your eating plan to your   individual calorie needs. This information is not intended to replace advice given to you by your health care provider. Make sure you discuss any questions you have with your health care provider. Document Revised: 02/02/2017 Document Reviewed: 02/14/2016 Elsevier Patient Education  2020 Elsevier Inc.  

## 2019-10-02 NOTE — Progress Notes (Signed)
Patient ID: Amy Zimmerman, female    DOB: Aug 19, 1950  Age: 69 y.o. MRN: [id number]    Subjective:  Subjective  HPI Amy Zimmerman presents for f/u bp, thyroid and labs.   No complaints   Review of Systems  Constitutional: Negative for appetite change, diaphoresis, fatigue and unexpected weight change.  Eyes: Negative for pain, redness and visual disturbance.  Respiratory: Negative for cough, chest tightness, shortness of breath and wheezing.   Cardiovascular: Negative for chest pain, palpitations and leg swelling.  Endocrine: Negative for cold intolerance, heat intolerance, polydipsia, polyphagia and polyuria.  Genitourinary: Negative for difficulty urinating, dysuria and frequency.  Neurological: Negative for dizziness, light-headedness, numbness and headaches.    History Past Medical History:  Diagnosis Date   Hypertension     She has a past surgical history that includes uterine polyps; Colonoscopy; and Mohs surgery.   Her family history includes Heart disease in her mother; Heart disease (age of onset: 30) in her father; Hyperlipidemia in her sister and sister; Hypertension in her mother; Kidney disease in her mother.She reports that she has never smoked. She has never used smokeless tobacco. She reports that she does not drink alcohol and does not use drugs.  Current Outpatient Medications on File Prior to Visit  Medication Sig Dispense Refill   atorvastatin (LIPITOR) 10 MG tablet TAKE 1 TABLET BY MOUTH EVERY DAY 90 tablet 1   calcium carbonate (OS-CAL) 600 MG TABS Take 600 mg by mouth 2 (two) times daily with a meal.       diclofenac Sodium (VOLTAREN) 1 % GEL Apply 4 g topically 4 (four) times daily. 100 g 3   hydrochlorothiazide (HYDRODIURIL) 50 MG tablet TAKE 1/2 TABLET BY MOUTH DAILY 45 tablet 1   metoprolol succinate (TOPROL-XL) 100 MG 24 hr tablet TAKE 1 TABLET ONCE DAILY   WITH OR IMMEDIATELY        FOLLOWING A MEAL 90 tablet 0   Omega-3 Fatty Acids (OMEGA-3  FISH OIL) 500 MG CAPS Take 1 capsule by mouth 2 (two) times daily.     No current facility-administered medications on file prior to visit.     Objective:  Objective  Physical Exam Vitals and nursing note reviewed.  Constitutional:      Appearance: She is well-developed.  HENT:     Head: Normocephalic and atraumatic.  Eyes:     Conjunctiva/sclera: Conjunctivae normal.  Neck:     Thyroid: No thyromegaly.     Vascular: No carotid bruit or JVD.  Cardiovascular:     Rate and Rhythm: Normal rate and regular rhythm.     Heart sounds: Normal heart sounds. No murmur heard.   Pulmonary:     Effort: Pulmonary effort is normal. No respiratory distress.     Breath sounds: Normal breath sounds. No wheezing or rales.  Chest:     Chest wall: No tenderness.  Musculoskeletal:     Cervical back: Normal range of motion and neck supple.  Neurological:     Mental Status: She is alert and oriented to person, place, and time.    BP (!) 140/86    Pulse 84    Temp 98.4 F (36.9 C) (Oral)    Resp 16    Ht 5' 2.5" (1.588 m)    Wt 143 lb (64.9 kg)    LMP  (LMP Unknown)    SpO2 98%    BMI 25.74 kg/m  Wt Readings from Last 3 Encounters:  10/02/19 143 lb (64.9 kg)  03/25/19 152 lb 12.8 oz (69.3 kg)  03/04/19 152 lb (68.9 kg)     Lab Results  Component Value Date   WBC 8.1 05/08/2018   HGB 14.7 05/08/2018   HCT 42.8 05/08/2018   PLT 281 05/08/2018   GLUCOSE 87 03/25/2019   CHOL 171 03/25/2019   TRIG 129.0 03/25/2019   HDL 57.10 03/25/2019   LDLDIRECT 159.9 11/21/2012   LDLCALC 88 03/25/2019   ALT 24 03/25/2019   AST 19 03/25/2019   NA 140 03/25/2019   K 3.9 03/25/2019   CL 99 03/25/2019   CREATININE 0.81 03/25/2019   BUN 14 03/25/2019   CO2 32 03/25/2019   TSH 1.86 03/25/2019   HGBA1C 5.5 02/18/2007    DG Bone Density  Result Date: 07/01/2019 EXAM: DUAL X-RAY ABSORPTIOMETRY (DXA) FOR BONE MINERAL DENSITY IMPRESSION: Referring Physician:  Rosalita Chessman Your patient completed a  BMD test using Lunar IDXA DXA system ( analysis version: 16 ) manufactured by [employer]. Technologist: KT PATIENT: Name: Amy Zimmerman, Amy Zimmerman Patient ID: [id number] Birth Date: [birth year] Height: 62.0 in. Sex: Female Measured: 07/01/2019 Weight: 148.8 lbs. Indications: Caucasian, Estrogen Deficient, Family History of Osteoporosis, History of Osteopenia, Postmenopausal, Secondary Osteoporosis Fractures: None Treatments: Calcium (E943.0), Vitamin D (E933.5) ASSESSMENT: The BMD measured at Forearm Radius 33% is 0.757 g/cm2 with a T-score of -1.5. This patient is considered OSTEOPENIC according to [facility]) criteria. The scan quality is good. Lumbar spine was not utilized due to advanced degenerative changes. Prior DXA exam performed on Hologic device measures only unilateral hip (not Total Mean Hip). Therefore, current Total Mean Hip  cannot be compared to prior exam. Site Region Measured Date Measured Age YA T-score BMD Significant CHANGE DualFemur Total Left 07/01/2019 67.3 -1.6 0.824 g/cm2 * DualFemur Total Left 08/12/2013 61.4 -1.2 0.858 g/cm2 Left Forearm Radius 33% 07/01/2019 67.3 -1.6 0.757 g/cm2 DualFemur Total Mean 07/01/2019 67.3 -1.2 0.856 g/cm2 World Health Organization [facility]) criteria for post-menopausal, Caucasian Women: Normal       T-score at or above -1 SD Osteopenia   T-score between -1 and -2.5 SD Osteoporosis T-score at or below -2.5 SD RECOMMENDATION: 1. All patients should optimize calcium and vitamin D intake. 2. Consider FDA approved medical therapies in postmenopausal women and men aged 20 years and older, based on the following: a. A hip or vertebral (clinical or morphometric) fracture b. T-score < or = -2.5 at the femoral neck or spine after appropriate evaluation to exclude secondary causes c. Low bone mass (T-score between -1.0 and -2.5 at the femoral neck or spine) and a 10 year probability of a hip fracture > or = 3% or a 10 year probability of a major  osteoporosis-related fracture > or = 20% based on the [place]-adapted WHO algorithm d. Clinician judgment and/or patient preferences may indicate treatment for people with 10-year fracture probabilities above or below these levels FOLLOW-UP: Patients with diagnosis of osteoporosis or at high risk for fracture should have regular bone mineral density tests. For patients eligible for Medicare, routine testing is allowed once every 2 years. The testing frequency can be increased to one year for patients who have rapidly progressing disease, those who are receiving or discontinuing medical therapy to restore bone mass, or have additional risk factors. I have reviewed this report and agree with the above findings. Mark A. Thornton Papas, M.D.  Radiology FRAX* 10-year Probability of Fracture Based on femoral neck BMD: DualFemur (Left) Major Osteoporotic Fracture: 8.7% Hip Fracture:  0.8% Population:                  [place] (Caucasian) Risk Factors:                Secondary Osteoporosis *FRAX is a Materials engineer of the [employer] for Metabolic Bone Disease, a [place] (WHO) [facility]. ASSESSMENT: The probability of a major osteoporotic fracture is 8.7 % within the next ten years. The probability of a hip fracture is 0.8 % within the next ten years. I have reviewed this report and agree with the above findings. Mark A. Thornton Papas, M.D. [facility] Radiology Electronically Signed   By: Lavonia Dana M.D.   On: 07/01/2019 09:27   MM DIGITAL SCREENING BILATERAL  Result Date: 07/01/2019 CLINICAL DATA:  Screening. EXAM: DIGITAL SCREENING BILATERAL MAMMOGRAM WITH CAD COMPARISON:  Previous exam(s). ACR Breast Density Category b: There are scattered areas of fibroglandular density. FINDINGS: There are no findings suspicious for malignancy. Images were processed with CAD. IMPRESSION: No mammographic evidence of malignancy. A result letter of this screening mammogram will  be mailed directly to the patient. RECOMMENDATION: Screening mammogram in one year. (Code:[id number]-B-01Y) BI-RADS CATEGORY  1: Negative. Electronically Signed   By: Abelardo Diesel M.D.   On: 07/01/2019 08:53     Assessment & Plan:  Plan  I am having Amy Zimmerman. Amy Zimmerman "Becky" maintain her calcium carbonate, Omega-3 Fish Oil, hydrochlorothiazide, diclofenac Sodium, atorvastatin, and metoprolol succinate.  No orders of the defined types were placed in this encounter.   Problem List Items Addressed This Visit      Unprioritized   Dyslipidemia    Tolerating statin, encouraged heart healthy diet, avoid trans fats, minimize simple carbs and saturated fats. Increase exercise as tolerated      Relevant Orders   Lipid panel   Comprehensive metabolic panel   Essential hypertension - Primary    Well controlled, no changes to meds. Encouraged heart healthy diet such as the DASH diet and exercise as tolerated. ----  Ran hight this am but repeat came down  con't current meds      Relevant Orders   Lipid panel   TSH   Comprehensive metabolic panel   Hypothyroidism    Cont meds Check labs today      Relevant Orders   TSH      Follow-up: Return in about 6 months (around 04/03/2020), or if symptoms worsen or fail to improve, for annual exam, fasting.  Ann Held, DO

## 2019-10-02 NOTE — Assessment & Plan Note (Signed)
Cont meds Check labs today

## 2019-10-02 NOTE — Assessment & Plan Note (Signed)
Well controlled, no changes to meds. Encouraged heart healthy diet such as the DASH diet and exercise as tolerated. ----  Ran hight this am but repeat came down  con't current meds

## 2019-10-07 NOTE — Telephone Encounter (Signed)
Happy to repeat it  Thyroid panel but the reason she felt bad is probably due to thyroid --- not the other way around

## 2019-11-17 DIAGNOSIS — D2271 Melanocytic nevi of right lower limb, including hip: Secondary | ICD-10-CM | POA: Diagnosis not present

## 2019-11-17 DIAGNOSIS — D225 Melanocytic nevi of trunk: Secondary | ICD-10-CM | POA: Diagnosis not present

## 2019-11-17 DIAGNOSIS — L814 Other melanin hyperpigmentation: Secondary | ICD-10-CM | POA: Diagnosis not present

## 2019-11-17 DIAGNOSIS — L821 Other seborrheic keratosis: Secondary | ICD-10-CM | POA: Diagnosis not present

## 2019-12-01 ENCOUNTER — Other Ambulatory Visit: Payer: Self-pay | Admitting: Cardiology

## 2019-12-01 DIAGNOSIS — I10 Essential (primary) hypertension: Secondary | ICD-10-CM

## 2019-12-07 ENCOUNTER — Other Ambulatory Visit: Payer: Self-pay | Admitting: Cardiology

## 2019-12-11 ENCOUNTER — Other Ambulatory Visit: Payer: Self-pay | Admitting: Family Medicine

## 2019-12-11 DIAGNOSIS — I10 Essential (primary) hypertension: Secondary | ICD-10-CM

## 2020-01-30 ENCOUNTER — Other Ambulatory Visit: Payer: Self-pay | Admitting: Cardiology

## 2020-02-02 NOTE — Telephone Encounter (Signed)
Rx request sent to pharmacy.  

## 2020-02-24 ENCOUNTER — Other Ambulatory Visit: Payer: Self-pay | Admitting: Cardiology

## 2020-02-24 DIAGNOSIS — I10 Essential (primary) hypertension: Secondary | ICD-10-CM

## 2020-02-24 NOTE — Telephone Encounter (Signed)
Rx refill sent to pharmacy. 

## 2020-03-18 ENCOUNTER — Other Ambulatory Visit: Payer: Self-pay | Admitting: Cardiology

## 2020-03-18 DIAGNOSIS — I10 Essential (primary) hypertension: Secondary | ICD-10-CM

## 2020-03-19 ENCOUNTER — Encounter: Payer: Self-pay | Admitting: Cardiology

## 2020-03-19 ENCOUNTER — Ambulatory Visit: Payer: Medicare HMO | Admitting: Cardiology

## 2020-03-19 ENCOUNTER — Other Ambulatory Visit: Payer: Self-pay

## 2020-03-19 VITALS — BP 166/96 | HR 76 | Ht 62.5 in | Wt 151.0 lb

## 2020-03-19 DIAGNOSIS — R079 Chest pain, unspecified: Secondary | ICD-10-CM

## 2020-03-19 DIAGNOSIS — I10 Essential (primary) hypertension: Secondary | ICD-10-CM

## 2020-03-19 DIAGNOSIS — R0789 Other chest pain: Secondary | ICD-10-CM

## 2020-03-19 DIAGNOSIS — E782 Mixed hyperlipidemia: Secondary | ICD-10-CM | POA: Diagnosis not present

## 2020-03-19 NOTE — Addendum Note (Signed)
Addended by: Jerl Santos R on: 03/19/2020 12:02 PM   Modules accepted: Orders

## 2020-03-19 NOTE — Progress Notes (Signed)
Cardiology Office Note:    Date:  03/19/2020   ID:  Amy Zimmerman, DOB 03-Sep-1950, MRN [id number]  PCP:  Carollee Herter, Alferd Apa, DO  Cardiologist:  Jenne Campus, MD    Referring MD: Carollee Herter, Alferd Apa, *   Chief Complaint  Patient presents with  . Follow-up  Am doing well but have his chest pain  History of Present Illness:    Amy Zimmerman is a 70 y.o. female with past medical history significant for essential hypertension, dyspnea exertion, dyslipidemia, hypothyroidism.  Comes today to my office for follow-up initially she says she is doing well she just retired and she is very happy with it however then she told me that she is having some chest pains.  She cannot tell me if it happens with exercise or at rest and happen at different situations burning-like lasting for few minutes no shortness of breath no sweating associated with this sensation only mild but concerning.  She is also thinking about being more active and try to exercise and will be did not have a chance to do it yet.  She is concerned about the sensation she has in her chest.  Past Medical History:  Diagnosis Date  . Hypertension     Past Surgical History:  Procedure Laterality Date  . COLONOSCOPY    . MOHS SURGERY     Face and Back  . uterine polyps      Current Medications: Current Meds  Medication Sig  . atorvastatin (LIPITOR) 10 MG tablet Take 1 tablet (10 mg total) by mouth daily. Please schedule appointment with Dr. Agustin Cree for refills.  . calcium carbonate (OS-CAL) 600 MG TABS Take 600 mg by mouth 2 (two) times daily with a meal.  . hydrochlorothiazide (HYDRODIURIL) 50 MG tablet TAKE 1/2 TABLET BY MOUTH DAILY. PLEASE MAKE YEARLY APPT WITH DR. Agustin Cree  . metoprolol succinate (TOPROL-XL) 100 MG 24 hr tablet Take 1 tablet (100 mg total) by mouth daily. Take with or immediately following a meal.  . Omega-3 Fatty Acids (OMEGA-3 FISH OIL) 500 MG CAPS Take 1 capsule by mouth 2 (two) times daily.      Allergies:   Patient has no known allergies.   Social History   Socioeconomic History  . Marital status: Widowed    Spouse name: Not on file  . Number of children: Not on file  . Years of education: Not on file  . Highest education level: Not on file  Occupational History  . Occupation: Barrister's clerk  Tobacco Use  . Smoking status: Never Smoker  . Smokeless tobacco: Never Used  Substance and Sexual Activity  . Alcohol use: No  . Drug use: No  . Sexual activity: Not Currently    Partners: Male  Other Topics Concern  . Not on file  Social History Narrative   Exercise- no   Social Determinants of Health   Financial Resource Strain: Not on file  Food Insecurity: Not on file  Transportation Needs: Not on file  Physical Activity: Not on file  Stress: Not on file  Social Connections: Not on file     Family History: The patient's family history includes Heart disease in her mother; Heart disease (age of onset: 62) in her father; Hyperlipidemia in her sister and sister; Hypertension in her mother; Kidney disease in her mother. There is no history of Colon cancer or Stomach cancer. ROS:   Please see the history of present illness.    All 14 point  review of systems negative except as described per history of present illness  EKGs/Labs/Other Studies Reviewed:      Recent Labs: 10/02/2019: ALT 31; BUN 18; Creatinine, Ser 0.76; Potassium 4.3; Sodium 138; TSH <0.01  Recent Lipid Panel    Component Value Date/Time   CHOL 138 10/02/2019 0936   CHOL 146 09/05/2018 0813   TRIG 110.0 10/02/2019 0936   HDL 45.60 10/02/2019 0936   HDL 52 09/05/2018 0813   CHOLHDL 3 10/02/2019 0936   VLDL 22.0 10/02/2019 0936   LDLCALC 70 10/02/2019 0936   LDLCALC 73 09/05/2018 0813   LDLDIRECT 159.9 11/21/2012 0850    Physical Exam:    VS:  BP (!) 166/96 (BP Location: Right Arm, Patient Position: Sitting)   Pulse 76   Ht 5' 2.5" (1.588 m)   Wt 151 lb (68.5 kg)   LMP   (LMP Unknown)   SpO2 96%   BMI 27.18 kg/m     Wt Readings from Last 3 Encounters:  03/19/20 151 lb (68.5 kg)  10/02/19 143 lb (64.9 kg)  03/25/19 152 lb 12.8 oz (69.3 kg)     GEN:  Well nourished, well developed in no acute distress HEENT: Normal NECK: No JVD; No carotid bruits LYMPHATICS: No lymphadenopathy CARDIAC: RRR, no murmurs, no rubs, no gallops RESPIRATORY:  Clear to auscultation without rales, wheezing or rhonchi  ABDOMEN: Soft, non-tender, non-distended MUSCULOSKELETAL:  No edema; No deformity  SKIN: Warm and dry LOWER EXTREMITIES: no swelling NEUROLOGIC:  Alert and oriented x 3 PSYCHIATRIC:  Normal affect   ASSESSMENT:    1. Essential hypertension   2. Mixed hyperlipidemia   3. Atypical chest pain    PLAN:    In order of problems listed above:  1. Essential hypertension blood pressure elevated in the office but she showed me blood pressure measurements from home and it was always good.  She is on hydrochlorothiazide and as well as Toprol-XL which I will continue. 2. Mixed dyslipidemia: I did review her K PN which show me her LDL of 70 HDL 45 she is on atorvastatin 10 mg daily which I will continue.  She is a good cholesterol profile. 3. Atypical chest pain with risk factors for coronary artery disease.  I offer her stress test she accepted if stress test is negative we will talk more about need to exercise and how to structure exercises.   Medication Adjustments/Labs and Tests Ordered: Current medicines are reviewed at length with the patient today.  Concerns regarding medicines are outlined above.  No orders of the defined types were placed in this encounter.  Medication changes: No orders of the defined types were placed in this encounter.   Signed, Park Liter, MD, [facility] 03/19/2020 11:46 AM    [place]

## 2020-03-19 NOTE — Patient Instructions (Signed)
Medication Instructions:  Your physician recommends that you continue on your current medications as directed. Please refer to the Current Medication list given to you today.  *If you need a refill on your cardiac medications before your next appointment, please call your pharmacy*   Lab Work: NONE If you have labs (blood work) drawn today and your tests are completely normal, you will receive your results only by: Marland Kitchen MyChart Message (if you have MyChart) OR . A paper copy in the mail If you have any lab test that is abnormal or we need to change your treatment, we will call you to review the results.   Testing/Procedures:   [facility] Cardiovascular Imaging at [facility] [address] [place], [place] [address] Phone: [phone]    Please arrive 15 minutes prior to your appointment time for registration and insurance purposes.  The test will take approximately 3 to 4 hours to complete; you may bring reading material.  If someone comes with you to your appointment, they will need to remain in the main lobby due to limited space in the testing area. **If you are pregnant or breastfeeding, please notify the nuclear lab prior to your appointment**  How to prepare for your Myocardial Perfusion Test: . Do not eat or drink 3 hours prior to your test, except you may have water. . Do not consume products containing caffeine (regular or decaffeinated) 12 hours prior to your test. (ex: coffee, chocolate, sodas, tea). .  .  . Do wear comfortable clothes (no dresses or overalls) and walking shoes, tennis shoes preferred (No heels or open toe shoes are allowed). . Do NOT wear cologne, perfume, aftershave, or lotions (deodorant is allowed). . If these instructions are not followed, your test will have to be rescheduled.  Please report to [address], [address] for your test.  If you have questions or concerns about your appointment, you can call the Nuclear Lab at  [phone].  If you cannot keep your appointment, please provide 24 hours notification to the Nuclear Lab, to avoid a possible $50 charge to your account.    Follow-Up: At [facility], you and your health needs are our priority.  As part of our continuing mission to provide you with exceptional heart care, we have created designated Provider Care Teams.  These Care Teams include your primary Cardiologist (physician) and Advanced Practice Providers (APPs -  Physician Assistants and Nurse Practitioners) who all work together to provide you with the care you need, when you need it.  We recommend signing up for the patient portal called "MyChart".  Sign up information is provided on this After Visit Summary.  MyChart is used to connect with patients for Virtual Visits (Telemedicine).  Patients are able to view lab/test results, encounter notes, upcoming appointments, etc.  Non-urgent messages can be sent to your provider as well.   To learn more about what you can do with MyChart, go to [email or web address].    Your next appointment:   6 month(s)  The format for your next appointment:   In Person  Provider:   Jenne Campus, MD   Other Instructions  Nuclear Medicine Exam A nuclear medicine exam is a safe and painless imaging test. It helps your health care provider detect and diagnose diseases. It also provides information about the ways your organs work and how they are structured. For a nuclear medicine exam, you will be given a radioactive material, called a tracer, that is absorbed  by your body's organs. A large scanning machine detects the radioactive tracer and creates pictures of the areas that your health care provider wants to know more about. There are several kinds of nuclear medicine exams. They include the following:  CT scan.  MRI scan.  PET scan.  SPECT scan. Tell your health care provider about:  Any allergies you have.  All medicines you are taking,  including vitamins, herbs, eye drops, creams, and over-the-counter medicines.  Any problems you or family members have had with anesthetic medicines.  Any blood disorders you have.  Any surgeries you have had.  Any medical conditions you have.  Whether you are pregnant, may be pregnant, or are breastfeeding. What are the risks? Generally, this is a safe procedure. However, problems may occur, such as:  An allergic reaction to the tracer. This is rare.  Exposure to radiation (a small amount). What happens before the procedure? Medicines Ask your health care provider about:  Changing or stopping your regular medicines. This is especially important if you are taking diabetes medicines or blood thinners.  Taking medicines such as aspirin and ibuprofen. These medicines can thin your blood. Do not take these medicines unless your health care provider tells you to take them.  Taking over-the-counter medicines, vitamins, herbs, and supplements. General instructions  Follow instructions from your health care provider about eating and drinking restrictions.  Do not wear jewelry.  Wear loose, comfortable clothing. You may be asked to wear a hospital gown for the procedure.  Bring previous imaging studies, such as X-rays, with you to the exam if they are available. What happens during the procedure?  An IV may be inserted into one of your veins.  You will be asked to lie on a table or sit in a chair.  You will be given the radioactive tracer. You may get: ? A pill or liquid to swallow. ? An injection. ? Medicine through your IV. ? A gas to inhale.  A large scanning machine will be used to create images of your body. After the pictures are taken, you may have to wait until your health care provider can make sure that enough images were taken. The procedure may vary among health care providers and hospitals.   What can I expect after the procedure?  It is up to you to get the  results of your procedure. Ask your health care provider, or the department that is doing the procedure, when your results will be ready. Also ask: ? How will I get my results? ? What are my treatment options? ? What other tests do I need? ? What are my next steps? Follow these instructions at home:  Drink enough water to keep your urine pale yellow (6-8 glasses). This helps to remove the radioactive tracer from your body.  You may return to your normal activities as told by your health care provider. Get help right away if you:  Have problems breathing. This symptom may represent a serious problem that is an emergency. Do not wait to see if the symptoms will go away. Get medical help right away. Call your local emergency services (911 in the [place] Do not drive yourself to the hospital. Summary  A nuclear medicine exam is a safe and painless imaging test. It provides information about how your organs are working. It is also used to detect and diagnose diseases.  During the procedure, you will be given a radioactive tracer. A large scanning machine will create images of  your body.  You may resume your normal activities after the procedure. Follow your health care provider's instructions.  Get help right away if you have problems breathing. This information is not intended to replace advice given to you by your health care provider. Make sure you discuss any questions you have with your health care provider. Document Revised: 06/28/2019 Document Reviewed: 06/28/2019 Elsevier Patient Education  2021 [employer].

## 2020-03-23 NOTE — Addendum Note (Signed)
Addended by: Senaida Ores on: 03/23/2020 02:49 PM   Modules accepted: Orders

## 2020-03-24 ENCOUNTER — Telehealth (HOSPITAL_COMMUNITY): Payer: Self-pay

## 2020-03-24 NOTE — Telephone Encounter (Signed)
Spoke with the patient, detailed instructions left on the patient's answering machine. Asked to call back with any questions. S.Ioana Louks EMTP 

## 2020-03-24 NOTE — Addendum Note (Signed)
Addended by: Jenne Campus on: 03/24/2020 01:22 PM   Modules accepted: Orders

## 2020-03-24 NOTE — Addendum Note (Signed)
Addended by: Jerl Santos R on: 03/24/2020 08:57 AM   Modules accepted: Orders

## 2020-03-25 ENCOUNTER — Other Ambulatory Visit: Payer: Self-pay

## 2020-03-25 ENCOUNTER — Ambulatory Visit (HOSPITAL_COMMUNITY): Payer: Medicare HMO | Attending: Cardiology

## 2020-03-25 DIAGNOSIS — R079 Chest pain, unspecified: Secondary | ICD-10-CM | POA: Diagnosis not present

## 2020-03-25 LAB — MYOCARDIAL PERFUSION IMAGING
LV dias vol: 56 mL (ref 46–106)
LV sys vol: 16 mL
Peak HR: 108 {beats}/min
Rest HR: 77 {beats}/min
SDS: 3
SRS: 0
SSS: 3
TID: 0.94

## 2020-03-25 MED ORDER — REGADENOSON 0.4 MG/5ML IV SOLN
0.4000 mg | Freq: Once | INTRAVENOUS | Status: AC
Start: 2020-03-25 — End: 2020-03-25
  Administered 2020-03-25: 0.4 mg via INTRAVENOUS

## 2020-03-25 MED ORDER — TECHNETIUM TC 99M TETROFOSMIN IV KIT
10.1000 | PACK | Freq: Once | INTRAVENOUS | Status: AC | PRN
  Administered 2020-03-25: 10.1 via INTRAVENOUS
  Filled 2020-03-25: qty 11

## 2020-03-25 MED ORDER — TECHNETIUM TC 99M TETROFOSMIN IV KIT
30.9000 | PACK | Freq: Once | INTRAVENOUS | Status: AC | PRN
  Administered 2020-03-25: 30.9 via INTRAVENOUS
  Filled 2020-03-25: qty 31

## 2020-04-05 ENCOUNTER — Other Ambulatory Visit: Payer: Self-pay

## 2020-04-05 ENCOUNTER — Other Ambulatory Visit: Payer: Self-pay | Admitting: Cardiology

## 2020-04-05 DIAGNOSIS — I10 Essential (primary) hypertension: Secondary | ICD-10-CM

## 2020-04-06 ENCOUNTER — Other Ambulatory Visit: Payer: Self-pay

## 2020-04-06 DIAGNOSIS — I10 Essential (primary) hypertension: Secondary | ICD-10-CM

## 2020-04-06 MED ORDER — HYDROCHLOROTHIAZIDE 50 MG PO TABS: ORAL_TABLET | ORAL | 3 refills | Status: DC

## 2020-04-27 ENCOUNTER — Other Ambulatory Visit: Payer: Self-pay | Admitting: Cardiology

## 2020-04-27 NOTE — Telephone Encounter (Signed)
Refill sent to pharmacy.   

## 2020-05-17 DIAGNOSIS — L821 Other seborrheic keratosis: Secondary | ICD-10-CM | POA: Diagnosis not present

## 2020-05-17 DIAGNOSIS — D2271 Melanocytic nevi of right lower limb, including hip: Secondary | ICD-10-CM | POA: Diagnosis not present

## 2020-05-17 DIAGNOSIS — D225 Melanocytic nevi of trunk: Secondary | ICD-10-CM | POA: Diagnosis not present

## 2020-05-17 DIAGNOSIS — L814 Other melanin hyperpigmentation: Secondary | ICD-10-CM | POA: Diagnosis not present

## 2020-05-17 DIAGNOSIS — Z85828 Personal history of other malignant neoplasm of skin: Secondary | ICD-10-CM | POA: Diagnosis not present

## 2020-05-17 DIAGNOSIS — L578 Other skin changes due to chronic exposure to nonionizing radiation: Secondary | ICD-10-CM | POA: Diagnosis not present

## 2020-05-17 DIAGNOSIS — Z85820 Personal history of malignant melanoma of skin: Secondary | ICD-10-CM | POA: Diagnosis not present

## 2020-06-02 DIAGNOSIS — Z85828 Personal history of other malignant neoplasm of skin: Secondary | ICD-10-CM | POA: Diagnosis not present

## 2020-06-02 DIAGNOSIS — Z8249 Family history of ischemic heart disease and other diseases of the circulatory system: Secondary | ICD-10-CM | POA: Diagnosis not present

## 2020-06-02 DIAGNOSIS — I10 Essential (primary) hypertension: Secondary | ICD-10-CM | POA: Diagnosis not present

## 2020-06-02 DIAGNOSIS — E785 Hyperlipidemia, unspecified: Secondary | ICD-10-CM | POA: Diagnosis not present

## 2020-06-07 ENCOUNTER — Other Ambulatory Visit: Payer: Self-pay | Admitting: Family Medicine

## 2020-06-07 DIAGNOSIS — Z1231 Encounter for screening mammogram for malignant neoplasm of breast: Secondary | ICD-10-CM

## 2020-07-15 ENCOUNTER — Other Ambulatory Visit: Payer: Self-pay | Admitting: Cardiology

## 2020-07-15 DIAGNOSIS — I10 Essential (primary) hypertension: Secondary | ICD-10-CM

## 2020-07-21 ENCOUNTER — Other Ambulatory Visit: Payer: Self-pay | Admitting: Family Medicine

## 2020-07-21 DIAGNOSIS — I10 Essential (primary) hypertension: Secondary | ICD-10-CM

## 2020-07-27 ENCOUNTER — Ambulatory Visit
Admission: RE | Admit: 2020-07-27 | Discharge: 2020-07-27 | Disposition: A | Discharge status: Home / Self Care | Payer: Medicare HMO | Source: Ambulatory Visit | Attending: Family Medicine | Admitting: Family Medicine

## 2020-07-27 ENCOUNTER — Other Ambulatory Visit: Payer: Self-pay

## 2020-07-27 DIAGNOSIS — Z1231 Encounter for screening mammogram for malignant neoplasm of breast: Secondary | ICD-10-CM

## 2020-08-18 ENCOUNTER — Other Ambulatory Visit: Payer: Self-pay | Admitting: Family Medicine

## 2020-08-18 DIAGNOSIS — I10 Essential (primary) hypertension: Secondary | ICD-10-CM

## 2020-08-24 DIAGNOSIS — H5203 Hypermetropia, bilateral: Secondary | ICD-10-CM | POA: Diagnosis not present

## 2020-09-20 ENCOUNTER — Other Ambulatory Visit: Payer: Self-pay | Admitting: Family Medicine

## 2020-09-20 DIAGNOSIS — I10 Essential (primary) hypertension: Secondary | ICD-10-CM

## 2020-09-21 ENCOUNTER — Other Ambulatory Visit: Payer: Self-pay | Admitting: Family Medicine

## 2020-09-21 DIAGNOSIS — I10 Essential (primary) hypertension: Secondary | ICD-10-CM

## 2020-09-21 NOTE — Telephone Encounter (Signed)
Patient comment: I have recently moved to [place] and I am in the process of finding  a new doctor.  In the meantime I plan to be out of town for approx. 3 months and would like to request a refill of 90 day supply for this medication providing my insurance allows.  Please advise.

## 2020-09-23 MED ORDER — METOPROLOL SUCCINATE ER 100 MG PO TB24: 100.0000 mg | ORAL_TABLET | Freq: Every day | ORAL | 0 refills | Status: AC

## 2020-11-09 DIAGNOSIS — J029 Acute pharyngitis, unspecified: Secondary | ICD-10-CM | POA: Diagnosis not present

## 2020-11-09 DIAGNOSIS — J019 Acute sinusitis, unspecified: Secondary | ICD-10-CM | POA: Diagnosis not present

## 2020-11-09 DIAGNOSIS — Z1152 Encounter for screening for COVID-19: Secondary | ICD-10-CM | POA: Diagnosis not present

## 2020-11-09 DIAGNOSIS — J3489 Other specified disorders of nose and nasal sinuses: Secondary | ICD-10-CM | POA: Diagnosis not present

## 2020-11-24 DIAGNOSIS — R053 Chronic cough: Secondary | ICD-10-CM | POA: Diagnosis not present

## 2020-12-27 DIAGNOSIS — L578 Other skin changes due to chronic exposure to nonionizing radiation: Secondary | ICD-10-CM | POA: Diagnosis not present

## 2020-12-27 DIAGNOSIS — L728 Other follicular cysts of the skin and subcutaneous tissue: Secondary | ICD-10-CM | POA: Diagnosis not present

## 2020-12-27 DIAGNOSIS — Z08 Encounter for follow-up examination after completed treatment for malignant neoplasm: Secondary | ICD-10-CM | POA: Diagnosis not present

## 2020-12-27 DIAGNOSIS — D225 Melanocytic nevi of trunk: Secondary | ICD-10-CM | POA: Diagnosis not present

## 2020-12-27 DIAGNOSIS — Z1283 Encounter for screening for malignant neoplasm of skin: Secondary | ICD-10-CM | POA: Diagnosis not present

## 2020-12-27 DIAGNOSIS — Z85820 Personal history of malignant melanoma of skin: Secondary | ICD-10-CM | POA: Diagnosis not present

## 2020-12-27 DIAGNOSIS — Z85828 Personal history of other malignant neoplasm of skin: Secondary | ICD-10-CM | POA: Diagnosis not present

## 2021-01-04 ENCOUNTER — Telehealth: Payer: Self-pay | Admitting: Family Medicine

## 2021-01-04 NOTE — Telephone Encounter (Signed)
Amy Zimmerman from [employer] would like prescription renewal for 90 days for medication Omega-3 Fatty Acids (omega-3 fish oil) 500 Mg caps.  Tel [phone] option 2 reference [id number]

## 2021-01-05 NOTE — Telephone Encounter (Signed)
Spoke with pharmacy. They stated they were calling regarding Metoprolol. I gave them a verbal to refill for 30 days but is past due for a office visit. Pharmacy advised  they will note on the bottle that pt needs a office visit for further refills.

## 2021-01-14 DIAGNOSIS — E0590 Thyrotoxicosis, unspecified without thyrotoxic crisis or storm: Secondary | ICD-10-CM | POA: Diagnosis not present

## 2021-01-14 DIAGNOSIS — Z23 Encounter for immunization: Secondary | ICD-10-CM | POA: Diagnosis not present

## 2021-01-14 DIAGNOSIS — E785 Hyperlipidemia, unspecified: Secondary | ICD-10-CM | POA: Diagnosis not present

## 2021-01-14 DIAGNOSIS — I10 Essential (primary) hypertension: Secondary | ICD-10-CM | POA: Diagnosis not present

## 2021-01-14 DIAGNOSIS — Z131 Encounter for screening for diabetes mellitus: Secondary | ICD-10-CM | POA: Diagnosis not present

## 2021-01-14 DIAGNOSIS — Z1211 Encounter for screening for malignant neoplasm of colon: Secondary | ICD-10-CM | POA: Diagnosis not present

## 2021-01-14 DIAGNOSIS — Z8639 Personal history of other endocrine, nutritional and metabolic disease: Secondary | ICD-10-CM | POA: Diagnosis not present

## 2021-01-14 DIAGNOSIS — Z0000 Encounter for general adult medical examination without abnormal findings: Secondary | ICD-10-CM | POA: Diagnosis not present

## 2021-01-14 DIAGNOSIS — C4330 Malignant melanoma of unspecified part of face: Secondary | ICD-10-CM | POA: Diagnosis not present

## 2021-04-09 ENCOUNTER — Other Ambulatory Visit: Payer: Self-pay | Admitting: Cardiology

## 2021-04-09 DIAGNOSIS — I10 Essential (primary) hypertension: Secondary | ICD-10-CM
